# Patient Record
Sex: Female | Born: 1985 | Race: White | Hispanic: No | Marital: Married | Smoking: Current every day smoker
Health system: Southern US, Community
[De-identification: ages and names within clinical notes are randomized; demographics above are authoritative.]

## PROBLEM LIST (undated history)

## (undated) DIAGNOSIS — Z862 Personal history of diseases of the blood and blood-forming organs and certain disorders involving the immune mechanism: Secondary | ICD-10-CM

## (undated) DIAGNOSIS — Z8709 Personal history of other diseases of the respiratory system: Secondary | ICD-10-CM

## (undated) DIAGNOSIS — F32A Depression, unspecified: Secondary | ICD-10-CM

## (undated) HISTORY — DX: Personal history of other diseases of the respiratory system: Z87.09

## (undated) HISTORY — DX: Personal history of diseases of the blood and blood-forming organs and certain disorders involving the immune mechanism: Z86.2

## (undated) HISTORY — PX: TONSILLECTOMY: SUR1361

## (undated) HISTORY — PX: OTHER SURGICAL HISTORY: SHX169

## (undated) HISTORY — DX: Depression, unspecified: F32.A

---
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
## 2003-05-12 ENCOUNTER — Ambulatory Visit (HOSPITAL_COMMUNITY): Admission: RE | Admit: 2003-05-12 | Discharge: 2003-05-12 | Payer: Self-pay | Admitting: Orthopedic Surgery

## 2003-06-10 ENCOUNTER — Ambulatory Visit (HOSPITAL_COMMUNITY): Admission: RE | Admit: 2003-06-10 | Discharge: 2003-06-10 | Payer: Self-pay | Admitting: Orthopedic Surgery

## 2004-04-17 ENCOUNTER — Emergency Department: Payer: Self-pay | Admitting: Emergency Medicine

## 2004-10-02 ENCOUNTER — Emergency Department: Payer: Self-pay | Admitting: Emergency Medicine

## 2004-10-05 ENCOUNTER — Emergency Department: Payer: Self-pay | Admitting: Unknown Physician Specialty

## 2005-06-08 ENCOUNTER — Observation Stay: Payer: Self-pay | Admitting: Obstetrics and Gynecology

## 2005-06-09 ENCOUNTER — Observation Stay: Payer: Self-pay | Admitting: Obstetrics and Gynecology

## 2007-08-17 ENCOUNTER — Emergency Department: Payer: Self-pay | Admitting: Emergency Medicine

## 2007-10-21 ENCOUNTER — Encounter: Payer: Self-pay | Admitting: Maternal & Fetal Medicine

## 2008-01-04 ENCOUNTER — Observation Stay: Payer: Self-pay | Admitting: Obstetrics and Gynecology

## 2008-01-28 ENCOUNTER — Observation Stay: Payer: Self-pay

## 2008-03-07 ENCOUNTER — Observation Stay: Payer: Self-pay

## 2008-03-15 ENCOUNTER — Inpatient Hospital Stay: Payer: Self-pay | Admitting: Obstetrics and Gynecology

## 2008-03-22 ENCOUNTER — Emergency Department: Payer: Self-pay | Admitting: Emergency Medicine

## 2008-03-26 ENCOUNTER — Emergency Department: Payer: Self-pay | Admitting: Unknown Physician Specialty

## 2011-08-16 ENCOUNTER — Emergency Department: Payer: Self-pay | Admitting: Emergency Medicine

## 2011-08-16 LAB — PREGNANCY, URINE: Pregnancy Test, Urine: NEGATIVE m[IU]/mL

## 2011-08-16 LAB — WET PREP, GENITAL

## 2011-08-16 LAB — URINALYSIS, COMPLETE
Bacteria: NONE SEEN
Bilirubin,UR: NEGATIVE
Blood: NEGATIVE
Glucose,UR: NEGATIVE mg/dL (ref 0–75)
Ketone: NEGATIVE
Leukocyte Esterase: NEGATIVE
Nitrite: NEGATIVE
Ph: 5 (ref 4.5–8.0)
Protein: NEGATIVE
RBC,UR: <1 /HPF (ref 0–5)
Specific Gravity: 1.023 (ref 1.003–1.030)
Squamous Epithelial: 8
WBC UR: 1 /HPF (ref 0–5)

## 2011-08-16 LAB — COMPREHENSIVE METABOLIC PANEL
Albumin: 4.4 g/dL (ref 3.4–5.0)
Alkaline Phosphatase: 62 U/L (ref 50–136)
Anion Gap: 9 (ref 7–16)
BUN: 15 mg/dL (ref 7–18)
Bilirubin,Total: 0.4 mg/dL (ref 0.2–1.0)
Calcium, Total: 9.3 mg/dL (ref 8.5–10.1)
Chloride: 105 mmol/L (ref 98–107)
Co2: 27 mmol/L (ref 21–32)
Creatinine: 0.69 mg/dL (ref 0.60–1.30)
EGFR (African American): >60
EGFR (Non-African Amer.): >60
Glucose: 88 mg/dL (ref 65–99)
Osmolality: 282 (ref 275–301)
Potassium: 3.8 mmol/L (ref 3.5–5.1)
SGOT(AST): 23 U/L (ref 15–37)
SGPT (ALT): 18 U/L
Sodium: 141 mmol/L (ref 136–145)
Total Protein: 7.6 g/dL (ref 6.4–8.2)

## 2011-08-16 LAB — CBC
HCT: 44.7 % (ref 35.0–47.0)
HGB: 15.2 g/dL (ref 12.0–16.0)
MCH: 31.4 pg (ref 26.0–34.0)
MCHC: 34.1 g/dL (ref 32.0–36.0)
MCV: 92 fL (ref 80–100)
Platelet: 257 10*3/uL (ref 150–440)
RBC: 4.85 10*6/uL (ref 3.80–5.20)
RDW: 11.7 % (ref 11.5–14.5)
WBC: 5.3 10*3/uL (ref 3.6–11.0)

## 2013-03-12 ENCOUNTER — Emergency Department: Payer: Self-pay | Admitting: Internal Medicine

## 2013-03-12 LAB — COMPREHENSIVE METABOLIC PANEL
Albumin: 3.2 g/dL — ABNORMAL LOW (ref 3.4–5.0)
Alkaline Phosphatase: 73 U/L (ref 50–136)
Anion Gap: 5 — ABNORMAL LOW (ref 7–16)
BUN: 10 mg/dL (ref 7–18)
Bilirubin,Total: 0.6 mg/dL (ref 0.2–1.0)
Calcium, Total: 8.5 mg/dL (ref 8.5–10.1)
Chloride: 105 mmol/L (ref 98–107)
Co2: 25 mmol/L (ref 21–32)
Creatinine: 0.77 mg/dL (ref 0.60–1.30)
EGFR (African American): >60
EGFR (Non-African Amer.): >60
Glucose: 114 mg/dL — ABNORMAL HIGH (ref 65–99)
Osmolality: 270 (ref 275–301)
Potassium: 4.3 mmol/L (ref 3.5–5.1)
SGOT(AST): 27 U/L (ref 15–37)
SGPT (ALT): 17 U/L (ref 12–78)
Sodium: 135 mmol/L — ABNORMAL LOW (ref 136–145)
Total Protein: 7 g/dL (ref 6.4–8.2)

## 2013-03-12 LAB — LIPASE, BLOOD: Lipase: 93 U/L (ref 73–393)

## 2013-03-12 LAB — URINALYSIS, COMPLETE
Glucose,UR: NEGATIVE mg/dL (ref 0–75)
Leukocyte Esterase: NEGATIVE
Protein: NEGATIVE
RBC,UR: <1 /HPF (ref 0–5)
Specific Gravity: 1.003 (ref 1.003–1.030)
Squamous Epithelial: 6

## 2013-03-12 LAB — CBC
HCT: 38.6 % (ref 35.0–47.0)
MCH: 32.2 pg (ref 26.0–34.0)
MCHC: 34.6 g/dL (ref 32.0–36.0)
MCV: 93 fL (ref 80–100)
RBC: 4.15 10*6/uL (ref 3.80–5.20)
WBC: 12.9 10*3/uL — ABNORMAL HIGH (ref 3.6–11.0)

## 2015-12-23 ENCOUNTER — Emergency Department
Admission: EM | Admit: 2015-12-23 | Discharge: 2015-12-24 | Disposition: A | Discharge status: Home / Self Care | Payer: Medicaid Other | Attending: Emergency Medicine | Admitting: Emergency Medicine

## 2015-12-23 DIAGNOSIS — R1030 Lower abdominal pain, unspecified: Secondary | ICD-10-CM | POA: Diagnosis present

## 2015-12-23 DIAGNOSIS — N83201 Unspecified ovarian cyst, right side: Secondary | ICD-10-CM | POA: Diagnosis not present

## 2015-12-23 DIAGNOSIS — N760 Acute vaginitis: Secondary | ICD-10-CM | POA: Diagnosis not present

## 2015-12-23 DIAGNOSIS — N83209 Unspecified ovarian cyst, unspecified side: Secondary | ICD-10-CM

## 2015-12-23 DIAGNOSIS — R109 Unspecified abdominal pain: Secondary | ICD-10-CM

## 2015-12-23 DIAGNOSIS — B9689 Other specified bacterial agents as the cause of diseases classified elsewhere: Secondary | ICD-10-CM

## 2015-12-23 LAB — URINALYSIS COMPLETE WITH MICROSCOPIC (ARMC ONLY)
BILIRUBIN URINE: NEGATIVE
Bacteria, UA: NONE SEEN
GLUCOSE, UA: NEGATIVE mg/dL
Hgb urine dipstick: NEGATIVE
KETONES UR: NEGATIVE mg/dL
Nitrite: NEGATIVE
Protein, ur: NEGATIVE mg/dL
SPECIFIC GRAVITY, URINE: 1.005 (ref 1.005–1.030)
pH: 8 (ref 5.0–8.0)

## 2015-12-23 LAB — COMPREHENSIVE METABOLIC PANEL
ALK PHOS: 71 U/L (ref 38–126)
ALT: 17 U/L (ref 14–54)
AST: 26 U/L (ref 15–41)
Albumin: 4.4 g/dL (ref 3.5–5.0)
Anion gap: 7 (ref 5–15)
BILIRUBIN TOTAL: 0.3 mg/dL (ref 0.3–1.2)
BUN: 11 mg/dL (ref 6–20)
CALCIUM: 9.2 mg/dL (ref 8.9–10.3)
CHLORIDE: 109 mmol/L (ref 101–111)
CO2: 24 mmol/L (ref 22–32)
CREATININE: 0.77 mg/dL (ref 0.44–1.00)
GFR calc Af Amer: >60 mL/min (ref >60)
Glucose, Bld: 76 mg/dL (ref 65–99)
Potassium: 3.9 mmol/L (ref 3.5–5.1)
Sodium: 140 mmol/L (ref 135–145)
TOTAL PROTEIN: 7.6 g/dL (ref 6.5–8.1)

## 2015-12-23 LAB — CBC
HCT: 42.3 % (ref 35.0–47.0)
HEMOGLOBIN: 14.6 g/dL (ref 12.0–16.0)
MCH: 31.3 pg (ref 26.0–34.0)
MCHC: 34.5 g/dL (ref 32.0–36.0)
MCV: 90.6 fL (ref 80.0–100.0)
PLATELETS: 269 10*3/uL (ref 150–440)
RBC: 4.67 MIL/uL (ref 3.80–5.20)
RDW: 13.2 % (ref 11.5–14.5)
WBC: 10 10*3/uL (ref 3.6–11.0)

## 2015-12-23 LAB — POCT PREGNANCY, URINE: PREG TEST UR: NEGATIVE

## 2015-12-23 MED ORDER — MORPHINE SULFATE (PF) 4 MG/ML IV SOLN
4.0000 mg | Freq: Once | INTRAVENOUS | Status: AC
  Administered 2015-12-24: 4 mg via INTRAVENOUS
  Filled 2015-12-23: qty 1

## 2015-12-23 MED ORDER — SODIUM CHLORIDE 0.9 % IV BOLUS (SEPSIS)
1000.0000 mL | Freq: Once | INTRAVENOUS | Status: AC
  Administered 2015-12-24: 1000 mL via INTRAVENOUS

## 2015-12-23 MED ORDER — ONDANSETRON HCL 4 MG/2ML IJ SOLN
4.0000 mg | Freq: Once | INTRAMUSCULAR | Status: AC
  Administered 2015-12-24: 4 mg via INTRAVENOUS
  Filled 2015-12-23: qty 2

## 2015-12-23 NOTE — ED Notes (Signed)
Pt in with co mid lower abd pain went to urgent care today and dx with constipation. Took miralax and had had watery stools since, last normal BM Tuesday.  Does have dysuria, and was told she had bacteria in urine but did not receive rx.

## 2015-12-23 NOTE — ED Provider Notes (Signed)
[facility] Emergency Department Provider Note   ____________________________________________  Time seen: Approximately 2323 PM  I have reviewed the triage vital signs and the nursing notes.   HISTORY  Chief Complaint Abdominal Pain    HPI Diamond Tyler is a 30 y.o. female who comes into the hospital today with abdominal pain. The patient reports that the pain started on Saturday. It is in her lower abdomen. The patient has not been taking anything for pain. She went to urgent care today and was told that she was constipated. She was given some MiraLAX and was told to come to the ER if it was worse. The patient reports that she did take MiraLAX powder bowel movement with no relief of her pain. She was also given a shot for pain but that has worn off. She reports that when she urinates she has a sharp pain in her abdomen. The patient denies any vaginal discharge or bleeding. She's had some nausea with no vomiting and no fever. The patient has never had this before. She reports that it feels like contractions. Her last menstrual period was 9 years ago as the patient has an IUD. The patient also reports it hurts to have a bowel movement.   No past medical history  There are no active problems to display for this patient.   No past surgical history  No current outpatient prescriptions  Allergies Motrin  No family history on file.  Social History Social History  Substance Use Topics  . Smoking status: None  . Smokeless tobacco: None  . Alcohol Use: None    Review of Systems Constitutional: No fever/chills Eyes: No visual changes. ENT: No sore throat. Cardiovascular: Denies chest pain. Respiratory: Denies shortness of breath. Gastrointestinal:  abdominal pain.  No nausea, no vomiting.  No diarrhea.  No constipation. Genitourinary: Negative for dysuria. Musculoskeletal: Negative for back pain. Skin: Negative for rash. Neurological: Negative  for headaches, focal weakness or numbness.  10-point ROS otherwise negative.  ____________________________________________   PHYSICAL EXAM:  VITAL SIGNS: ED Triage Vitals  Enc Vitals Group     BP 12/23/15 2000 137/95 mmHg     Pulse Rate 12/23/15 2000 118     Resp 12/23/15 2000 18     Temp 12/23/15 2000 98.4 F (36.9 C)     Temp Source 12/23/15 2000 Oral     SpO2 12/23/15 2000 100 %     Weight 12/23/15 2000 125 lb (56.7 kg)     Height 12/23/15 2000 5' (1.524 m)     Head Cir --      Peak Flow --      Pain Score 12/23/15 2001 7     Pain Loc --      Pain Edu? --      Excl. in GC? --     Constitutional: Alert and oriented. Well appearing and in moderate distress. Eyes: Conjunctivae are normal. PERRL. EOMI. Head: Atraumatic. Nose: No congestion/rhinnorhea. Mouth/Throat: Mucous membranes are moist.  Oropharynx non-erythematous. Cardiovascular: Normal rate, regular rhythm. Grossly normal heart sounds.  Good peripheral circulation. Respiratory: Normal respiratory effort.  No retractions. Lungs CTAB. Gastrointestinal: Soft with lower abdominal tenderness to palpation. No distention. Positive bowel sounds Musculoskeletal: No lower extremity tenderness nor edema.   Neurologic:  Normal speech and language.  Skin:  Skin is warm, dry and intact.  Psychiatric: Mood and affect are normal.   ____________________________________________   LABS (all labs ordered are listed, but only abnormal results are displayed)  Labs Reviewed  WET PREP, GENITAL - Abnormal; Notable for the following:    Clue Cells Wet Prep HPF POC PRESENT (*)    WBC, Wet Prep HPF POC RARE (*)    All other components within normal limits  URINALYSIS COMPLETEWITH MICROSCOPIC ([facility] ONLY) - Abnormal; Notable for the following:    Color, Urine STRAW (*)    APPearance CLEAR (*)    Leukocytes, UA TRACE (*)    Squamous Epithelial / LPF 0-5 (*)    All other components within normal limits  CHLAMYDIA/NGC RT PCR ([facility]  ONLY)  CBC  COMPREHENSIVE METABOLIC PANEL  POCT PREGNANCY, URINE   ____________________________________________  EKG  none ____________________________________________  RADIOLOGY  CT abdomen and pelvis: Right ovarian cyst with possible rupture, otherwise unremarkable CT of the abdomen and pelvis.  Ultrasound pelvis: Somewhat complex probably hemorrhagic right ovarian cyst, intrauterine device in place, unremarkable uterus and the left ovary. ____________________________________________   PROCEDURES  Procedure(s) performed: None  Critical Care performed: No  ____________________________________________   INITIAL IMPRESSION / ASSESSMENT AND PLAN / ED COURSE  Pertinent labs & imaging results that were available during my care of the patient were reviewed by me and considered in my medical decision making (see chart for details).  His is a 30 year old female who comes into the hospital today with some abdominal pain. The patient has been having this pain for almost a week. She is not been taken anything for pain but reports it is very uncomfortable. The patient does have a history of an IUD. I will do a pelvic exam and give the patient a dose of morphine and Zofran. She will be reassessed once I received the pelvic exam and the decision will be made whether she needs an ultrasound or CT scan.  The patient initially received an ultrasound which showed a hemorrhagic right ovarian cyst. Since the patient was having uterine pain as well I sent her for a CT scan which showed the same thing. The patient received 2 doses of morphine as well as a dose of Percocet. I will give her some antibiotics for her bacterial vaginosis but she'll be discharged home to follow-up with her primary care physician. The patient has some further complaints or concerns at this time. ____________________________________________   FINAL CLINICAL IMPRESSION(S) / ED DIAGNOSES  Final diagnoses:    Abdominal pain  Hemorrhagic ovarian cyst  Bacterial vaginosis      NEW MEDICATIONS STARTED DURING THIS VISIT:  Discharge Medication List as of 12/24/2015  3:54 AM    START taking these medications   Details  metroNIDAZOLE (FLAGYL) 500 MG tablet Take 1 tablet (500 mg total) by mouth 3 (three) times daily., Starting 12/24/2015, Until Fri 12/31/15, Print         Note:  This document was prepared using Dragon voice recognition software and may include unintentional dictation errors.    Rebecka Apley, MD 12/24/15 [phone]

## 2015-12-24 ENCOUNTER — Encounter: Payer: Self-pay | Admitting: Radiology

## 2015-12-24 ENCOUNTER — Emergency Department: Payer: Medicaid Other

## 2015-12-24 LAB — CHLAMYDIA/NGC RT PCR (ARMC ONLY)
CHLAMYDIA TR: NOT DETECTED
N gonorrhoeae: NOT DETECTED

## 2015-12-24 LAB — WET PREP, GENITAL
Sperm: NONE SEEN
TRICH WET PREP: NONE SEEN
Yeast Wet Prep HPF POC: NONE SEEN

## 2015-12-24 MED ORDER — MORPHINE SULFATE (PF) 4 MG/ML IV SOLN
4.0000 mg | Freq: Once | INTRAVENOUS | Status: AC
  Administered 2015-12-24: 4 mg via INTRAVENOUS
  Filled 2015-12-24: qty 1

## 2015-12-24 MED ORDER — IOPAMIDOL (ISOVUE-300) INJECTION 61%
100.0000 mL | Freq: Once | INTRAVENOUS | Status: AC | PRN
  Administered 2015-12-24: 100 mL via INTRAVENOUS

## 2015-12-24 MED ORDER — METRONIDAZOLE 500 MG PO TABS: 500.0000 mg | ORAL_TABLET | Freq: Three times a day (TID) | ORAL | Status: AC

## 2015-12-24 MED ORDER — OXYCODONE-ACETAMINOPHEN 5-325 MG PO TABS
1.0000 | ORAL_TABLET | Freq: Once | ORAL | Status: AC
Start: 2015-12-24 — End: 2015-12-24
  Administered 2015-12-24: 1 via ORAL
  Filled 2015-12-24: qty 1

## 2015-12-24 MED ORDER — DIATRIZOATE MEGLUMINE & SODIUM 66-10 % PO SOLN
15.0000 mL | Freq: Once | ORAL | Status: AC
  Administered 2015-12-24: 15 mL via ORAL

## 2015-12-24 MED ORDER — OXYCODONE-ACETAMINOPHEN 5-325 MG PO TABS: 1.0000 | ORAL_TABLET | Freq: Four times a day (QID) | ORAL | Status: DC | PRN

## 2015-12-24 NOTE — ED Notes (Signed)
Pt to CT via stretcher accomp by CT tech 

## 2015-12-24 NOTE — ED Notes (Signed)
CT tech notified pt completed drinking contrast 

## 2015-12-24 NOTE — ED Notes (Signed)
Pt to u/s via stretcher accomp by tech

## 2015-12-24 NOTE — Discharge Instructions (Signed)
Bacterial Vaginosis °Bacterial vaginosis is a vaginal infection that occurs when the normal balance of bacteria in the vagina is disrupted. It results from an overgrowth of certain bacteria. This is the most common vaginal infection in women of childbearing age. Treatment is important to prevent complications, especially in pregnant women, as it can cause a premature delivery. °CAUSES  °Bacterial vaginosis is caused by an increase in harmful bacteria that are normally present in smaller amounts in the vagina. Several different kinds of bacteria can cause bacterial vaginosis. However, the reason that the condition develops is not fully understood. °RISK FACTORS °Certain activities or behaviors can put you at an increased risk of developing bacterial vaginosis, including: °· Having a new sex partner or multiple sex partners. °· Douching. °· Using an intrauterine device (IUD) for contraception. °Women do not get bacterial vaginosis from toilet seats, bedding, swimming pools, or contact with objects around them. °SIGNS AND SYMPTOMS  °Some women with bacterial vaginosis have no signs or symptoms. Common symptoms include: °· Grey vaginal discharge. °· A fishlike odor with discharge, especially after sexual intercourse. °· Itching or burning of the vagina and vulva. °· Burning or pain with urination. °DIAGNOSIS  °Your health care provider will take a medical history and examine the vagina for signs of bacterial vaginosis. A sample of vaginal fluid may be taken. Your health care provider will look at this sample under a microscope to check for bacteria and abnormal cells. A vaginal pH test may also be done.  °TREATMENT  °Bacterial vaginosis may be treated with antibiotic medicines. These may be given in the form of a pill or a vaginal cream. A second round of antibiotics may be prescribed if the condition comes back after treatment. Because bacterial vaginosis increases your risk for sexually transmitted diseases, getting  treated can help reduce your risk for chlamydia, gonorrhea, HIV, and herpes. °HOME CARE INSTRUCTIONS  °· Only take over-the-counter or prescription medicines as directed by your health care provider. °· If antibiotic medicine was prescribed, take it as directed. Make sure you finish it even if you start to feel better. °· Tell all sexual partners that you have a vaginal infection. They should see their health care provider and be treated if they have problems, such as a mild rash or itching. °· During treatment, it is important that you follow these instructions: °¨ Avoid sexual activity or use condoms correctly. °¨ Do not douche. °¨ Avoid alcohol as directed by your health care provider. °¨ Avoid breastfeeding as directed by your health care provider. °SEEK MEDICAL CARE IF:  °· Your symptoms are not improving after 3 days of treatment. °· You have increased discharge or pain. °· You have a fever. °MAKE SURE YOU:  °· Understand these instructions. °· Will watch your condition. °· Will get help right away if you are not doing well or get worse. °FOR MORE INFORMATION  °Centers for Disease Control and Prevention, Division of STD Prevention: www.cdc.gov/std °American Sexual Health Association (ASHA): www.ashastd.org  °  °This information is not intended to replace advice given to you by your health care provider. Make sure you discuss any questions you have with your health care provider. °  °Document Released: 07/03/2005 Document Revised: 07/24/2014 Document Reviewed: 02/12/2013 °Elsevier Interactive Patient Education ©2016 [employer] ° ° °Abdominal Pain, Adult °Many things can cause abdominal pain. Usually, abdominal pain is not caused by a disease and will improve without treatment. It can often be observed and treated at home. Your health care   provider will do a physical exam and possibly order blood tests and X-rays to help determine the seriousness of your pain. However, in many cases, more time must pass  before a clear cause of the pain can be found. Before that point, your health care provider may not know if you need more testing or further treatment. °HOME CARE INSTRUCTIONS °Monitor your abdominal pain for any changes. The following actions may help to alleviate any discomfort you are experiencing: °· Only take over-the-counter or prescription medicines as directed by your health care provider. °· Do not take laxatives unless directed to do so by your health care provider. °· Try a clear liquid diet (broth, tea, or water) as directed by your health care provider. Slowly move to a bland diet as tolerated. °SEEK MEDICAL CARE IF: °· You have unexplained abdominal pain. °· You have abdominal pain associated with nausea or diarrhea. °· You have pain when you urinate or have a bowel movement. °· You experience abdominal pain that wakes you in the night. °· You have abdominal pain that is worsened or improved by eating food. °· You have abdominal pain that is worsened with eating fatty foods. °· You have a fever. °SEEK IMMEDIATE MEDICAL CARE IF: °· Your pain does not go away within 2 hours. °· You keep throwing up (vomiting). °· Your pain is felt only in portions of the abdomen, such as the right side or the left lower portion of the abdomen. °· You pass bloody or black tarry stools. °MAKE SURE YOU: °· Understand these instructions. °· Will watch your condition. °· Will get help right away if you are not doing well or get worse. °  °This information is not intended to replace advice given to you by your health care provider. Make sure you discuss any questions you have with your health care provider. °  °Document Released: 04/12/2005 Document Revised: 03/24/2015 Document Reviewed: 03/12/2013 °Elsevier Interactive Patient Education ©2016 Elsevier Inc. ° °

## 2016-01-06 ENCOUNTER — Ambulatory Visit
Admission: RE | Admit: 2016-01-06 | Discharge: 2016-01-06 | Disposition: A | Discharge status: Home / Self Care | Payer: Medicaid Other | Source: Ambulatory Visit | Attending: Obstetrics and Gynecology | Admitting: Obstetrics and Gynecology

## 2016-01-06 ENCOUNTER — Ambulatory Visit: Payer: Medicaid Other | Admitting: Anesthesiology

## 2016-01-06 ENCOUNTER — Encounter: Payer: Self-pay | Admitting: *Deleted

## 2016-01-06 ENCOUNTER — Encounter
Admission: RE | Disposition: A | Discharge status: Home / Self Care | Payer: Self-pay | Source: Ambulatory Visit | Attending: Obstetrics and Gynecology

## 2016-01-06 DIAGNOSIS — N736 Female pelvic peritoneal adhesions (postinfective): Secondary | ICD-10-CM | POA: Insufficient documentation

## 2016-01-06 DIAGNOSIS — N83201 Unspecified ovarian cyst, right side: Secondary | ICD-10-CM | POA: Diagnosis present

## 2016-01-06 DIAGNOSIS — R1032 Left lower quadrant pain: Secondary | ICD-10-CM | POA: Diagnosis present

## 2016-01-06 DIAGNOSIS — F17210 Nicotine dependence, cigarettes, uncomplicated: Secondary | ICD-10-CM | POA: Diagnosis not present

## 2016-01-06 HISTORY — PX: LAPAROSCOPY: SHX197

## 2016-01-06 LAB — TYPE AND SCREEN
ABO/RH(D): O NEG
ANTIBODY SCREEN: NEGATIVE

## 2016-01-06 LAB — CBC
HEMATOCRIT: 43.4 % (ref 35.0–47.0)
Hemoglobin: 14.6 g/dL (ref 12.0–16.0)
MCH: 30.9 pg (ref 26.0–34.0)
MCHC: 33.7 g/dL (ref 32.0–36.0)
MCV: 91.8 fL (ref 80.0–100.0)
Platelets: 266 10*3/uL (ref 150–440)
RBC: 4.73 MIL/uL (ref 3.80–5.20)
RDW: 13.1 % (ref 11.5–14.5)
WBC: 8.1 10*3/uL (ref 3.6–11.0)

## 2016-01-06 LAB — POCT PREGNANCY, URINE: Preg Test, Ur: NEGATIVE

## 2016-01-06 LAB — COMPREHENSIVE METABOLIC PANEL
ALBUMIN: 4.6 g/dL (ref 3.5–5.0)
ALT: 19 U/L (ref 14–54)
ANION GAP: 7 (ref 5–15)
AST: 25 U/L (ref 15–41)
Alkaline Phosphatase: 59 U/L (ref 38–126)
BILIRUBIN TOTAL: 0.5 mg/dL (ref 0.3–1.2)
BUN: 13 mg/dL (ref 6–20)
CO2: 26 mmol/L (ref 22–32)
Calcium: 9.2 mg/dL (ref 8.9–10.3)
Chloride: 104 mmol/L (ref 101–111)
Creatinine, Ser: 0.77 mg/dL (ref 0.44–1.00)
GFR calc Af Amer: >60 mL/min (ref >60)
GLUCOSE: 98 mg/dL (ref 65–99)
POTASSIUM: 4.3 mmol/L (ref 3.5–5.1)
Sodium: 137 mmol/L (ref 135–145)
TOTAL PROTEIN: 7.4 g/dL (ref 6.5–8.1)

## 2016-01-06 SURGERY — LAPAROSCOPY, DIAGNOSTIC
Anesthesia: General | Wound class: Clean Contaminated

## 2016-01-06 MED ORDER — DEXAMETHASONE SODIUM PHOSPHATE 10 MG/ML IJ SOLN
INTRAMUSCULAR | Status: DC | PRN
  Administered 2016-01-06: 10 mg via INTRAVENOUS

## 2016-01-06 MED ORDER — FENTANYL CITRATE (PF) 100 MCG/2ML IJ SOLN
INTRAMUSCULAR | Status: DC | PRN
  Administered 2016-01-06: 100 ug via INTRAVENOUS
  Administered 2016-01-06 (×2): 50 ug via INTRAVENOUS

## 2016-01-06 MED ORDER — FENTANYL CITRATE (PF) 100 MCG/2ML IJ SOLN
INTRAMUSCULAR | Status: AC
  Administered 2016-01-06: 25 ug via INTRAVENOUS
  Filled 2016-01-06: qty 2

## 2016-01-06 MED ORDER — OXYCODONE HCL 5 MG PO TABS
ORAL_TABLET | ORAL | Status: AC
  Filled 2016-01-06: qty 1

## 2016-01-06 MED ORDER — ROCURONIUM BROMIDE 100 MG/10ML IV SOLN
INTRAVENOUS | Status: DC | PRN
  Administered 2016-01-06: 30 mg via INTRAVENOUS

## 2016-01-06 MED ORDER — ONDANSETRON HCL 4 MG/2ML IJ SOLN
INTRAMUSCULAR | Status: DC | PRN
  Administered 2016-01-06: 4 mg via INTRAVENOUS

## 2016-01-06 MED ORDER — KETOROLAC TROMETHAMINE 30 MG/ML IJ SOLN
INTRAMUSCULAR | Status: DC | PRN
  Administered 2016-01-06: 30 mg via INTRAVENOUS

## 2016-01-06 MED ORDER — LACTATED RINGERS IV SOLN
INTRAVENOUS | Status: DC
  Administered 2016-01-06: 17:00:00 via INTRAVENOUS
  Administered 2016-01-06: 125 mL/h via INTRAVENOUS

## 2016-01-06 MED ORDER — MIDAZOLAM HCL 2 MG/2ML IJ SOLN
INTRAMUSCULAR | Status: DC | PRN
  Administered 2016-01-06: 2 mg via INTRAVENOUS

## 2016-01-06 MED ORDER — OXYCODONE-ACETAMINOPHEN 5-325 MG PO TABS
2.0000 | ORAL_TABLET | Freq: Four times a day (QID) | ORAL | Status: DC | PRN
  Administered 2016-01-06: 2 via ORAL

## 2016-01-06 MED ORDER — LIDOCAINE HCL (CARDIAC) 20 MG/ML IV SOLN
INTRAVENOUS | Status: DC | PRN
  Administered 2016-01-06: 10 mg via INTRAVENOUS

## 2016-01-06 MED ORDER — OXYCODONE-ACETAMINOPHEN 5-325 MG PO TABS: 2.0000 | ORAL_TABLET | Freq: Four times a day (QID) | ORAL | Status: AC | PRN

## 2016-01-06 MED ORDER — ONDANSETRON HCL 4 MG/2ML IJ SOLN: 4.0000 mg | Freq: Once | INTRAMUSCULAR | Status: DC | PRN

## 2016-01-06 MED ORDER — FENTANYL CITRATE (PF) 100 MCG/2ML IJ SOLN
25.0000 ug | INTRAMUSCULAR | Status: AC | PRN
  Administered 2016-01-06 (×6): 25 ug via INTRAVENOUS

## 2016-01-06 MED ORDER — PHENYLEPHRINE HCL 10 MG/ML IJ SOLN
INTRAMUSCULAR | Status: DC | PRN
  Administered 2016-01-06: 100 ug via INTRAVENOUS

## 2016-01-06 MED ORDER — BUPIVACAINE HCL (PF) 0.5 % IJ SOLN
INTRAMUSCULAR | Status: AC
  Filled 2016-01-06: qty 30

## 2016-01-06 MED ORDER — PROPOFOL 10 MG/ML IV BOLUS
INTRAVENOUS | Status: DC | PRN
  Administered 2016-01-06: 130 mg via INTRAVENOUS

## 2016-01-06 MED ORDER — BUPIVACAINE HCL 0.5 % IJ SOLN
INTRAMUSCULAR | Status: DC | PRN
  Administered 2016-01-06: 5 mL

## 2016-01-06 MED ORDER — OXYCODONE-ACETAMINOPHEN 5-325 MG PO TABS
ORAL_TABLET | ORAL | Status: AC
  Administered 2016-01-06: 2 via ORAL
  Filled 2016-01-06: qty 2

## 2016-01-06 SURGICAL SUPPLY — 39 items
BLADE SURG SZ11 CARB STEEL (BLADE) ×4 IMPLANT
CANISTER SUCT 1200ML W/VALVE (MISCELLANEOUS) ×4 IMPLANT
CHLORAPREP W/TINT 26ML (MISCELLANEOUS) ×4 IMPLANT
CORD MONOPOLAR M/FML 12FT (MISCELLANEOUS) ×3 IMPLANT
DRAPE LEGGINS SURG 28X43 STRL (DRAPES) ×4 IMPLANT
DRAPE SHEET LG 3/4 BI-LAMINATE (DRAPES) ×4 IMPLANT
DRAPE UNDER BUTTOCK W/FLU (DRAPES) ×4 IMPLANT
ELECT REM PT RETURN 9FT ADLT (ELECTROSURGICAL) ×4
ELECTRODE REM PT RTRN 9FT ADLT (ELECTROSURGICAL) ×2 IMPLANT
GLOVE BIO SURGEON STRL SZ7 (GLOVE) ×8 IMPLANT
GLOVE BIOGEL PI IND STRL 7.5 (GLOVE) ×2 IMPLANT
GLOVE BIOGEL PI INDICATOR 7.5 (GLOVE) ×2
GOWN STRL REUS W/ TWL LRG LVL3 (GOWN DISPOSABLE) ×4 IMPLANT
GOWN STRL REUS W/TWL LRG LVL3 (GOWN DISPOSABLE) ×8
IRRIGATION STRYKERFLOW (MISCELLANEOUS) ×2 IMPLANT
IRRIGATOR STRYKERFLOW (MISCELLANEOUS) ×4
IV LACTATED RINGERS 1000ML (IV SOLUTION) ×4 IMPLANT
KIT RM TURNOVER CYSTO AR (KITS) ×4 IMPLANT
LABEL OR SOLS (LABEL) ×4 IMPLANT
LIQUID BAND (GAUZE/BANDAGES/DRESSINGS) ×4 IMPLANT
NDL SAFETY 22GX1.5 (NEEDLE) ×4 IMPLANT
NS IRRIG 500ML POUR BTL (IV SOLUTION) ×4 IMPLANT
PACK LAP CHOLECYSTECTOMY (MISCELLANEOUS) ×4 IMPLANT
PAD OB MATERNITY 4.3X12.25 (PERSONAL CARE ITEMS) ×4 IMPLANT
PAD PREP 24X41 OB/GYN DISP (PERSONAL CARE ITEMS) ×4 IMPLANT
SCISSORS METZENBAUM CVD 33 (INSTRUMENTS) IMPLANT
SHEARS HARMONIC ACE PLUS 36CM (ENDOMECHANICALS) IMPLANT
SLEEVE ENDOPATH XCEL 5M (ENDOMECHANICALS) ×4 IMPLANT
SOL PREP PVP 2OZ (MISCELLANEOUS) ×4
SOLUTION PREP PVP 2OZ (MISCELLANEOUS) ×2 IMPLANT
SURGILUBE 2OZ TUBE FLIPTOP (MISCELLANEOUS) ×4 IMPLANT
SUT MNCRL 4-0 (SUTURE)
SUT MNCRL 4-0 27XMFL (SUTURE)
SUT VIC AB 2-0 UR6 27 (SUTURE) IMPLANT
SUTURE MNCRL 4-0 27XMF (SUTURE) IMPLANT
TROCAR ENDO BLADELESS 11MM (ENDOMECHANICALS) IMPLANT
TROCAR XCEL NON-BLD 5MMX100MML (ENDOMECHANICALS) ×4 IMPLANT
TROCAR XCEL UNIV SLVE 11M 100M (ENDOMECHANICALS) IMPLANT
TUBING INSUFFLATOR HI FLOW (MISCELLANEOUS) ×4 IMPLANT

## 2016-01-06 NOTE — Discharge Instructions (Signed)

## 2016-01-06 NOTE — Anesthesia Procedure Notes (Signed)
Procedure Name: Intubation Date/Time: 01/06/2016 3:13 PM Performed by: Omer JackWEATHERLY, Diamond Wyse Pre-anesthesia Checklist: Patient identified, Patient being monitored, Timeout performed, Emergency Drugs available and Suction available Patient Re-evaluated:Patient Re-evaluated prior to inductionOxygen Delivery Method: Circle system utilized Preoxygenation: Pre-oxygenation with 100% oxygen Intubation Type: IV induction Ventilation: Mask ventilation without difficulty Laryngoscope Size: Mac and 3 Grade View: Grade I Tube type: Oral Tube size: 7.0 mm Number of attempts: 1 Airway Equipment and Method: Stylet Placement Confirmation: ETT inserted through vocal cords under direct vision,  positive ETCO2 and breath sounds checked- equal and bilateral Secured at: 21 cm Tube secured with: Tape Dental Injury: Teeth and Oropharynx as per pre-operative assessment

## 2016-01-06 NOTE — Op Note (Signed)
Operative Report  Pre-Op Diagnosis:  1) left lower quadrant abdominal pain 2) right ovarian cyst  Post-Op Diagnosis:  1) left lower quadrant abdominal pain 2) right ovarian cyst   Procedures:  1. Diagnostic laparosopy 2. Lysis of adhesions  Primary Surgeon: Thomasene MohairStephen Jackson, MD   EBL: 50 ml   IVF: 900 mL   Urine output: 200 mL  Specimens: None  Drains: None  Complications: None   Disposition: PACU   Condition: Stable   Findings:  1) adhesions of right pelvic sidewall, omentum, sigmoid epiploica to right lower posterior uterine body 2) normal-appearing appendix 3) Otherwise, normal-appearing uterus, fallopian tubes, and left ovary.  4) The right ovary could not fully be visualized as it was adherent to the right pelvic sidewall in the area of inflammation. No apparent cysts or other abnormalities.  Procedure Summary:  The patient was taken to the operating room where general anesthesia was administered and found to be adequate. She was placed in the dorsal supine lithotomy position in [place] stirrups and prepped and draped in usual sterile fashion. After a timeout was called an indwelling catheter was placed in her bladder. A sterile speculum was placed in the vagina and a single-tooth tenaculum was used to grasp the anterior lip of the cervix. An acorn uterine manipulator was affixed to the tenaculum. The speculum was removed from the vagina.  Attention was turned to the abdomen where after injection of local anesthetic, a 5 mm infraumbilical incision was made with the scalpel. Entry into the abdomen was obtained via Optiview trocar technique (a blunt entry technique with camera visualization through the obturator upon entry). Verification of entry into the abdomen was obtained using opening pressures. The abdomen was insufflated with CO2. The camera was introduced through the trocar with verification of atraumatic entry.  After an initial survey of the abdomen and pelvis  with the above-noted findings, a 5mm suprapubic port site was created under direct intraabdominal camera visualization without difficulty.  The adhesions of the omentum and epiploica to the uterus were easily lysed with gentle traction.  Inspection of the omentum and epiploica showed an area of either hematoma or necrosis as it was dark in color.  This segment was about 2 cm in length.    The surgery was terminated at this point.  Verification of hemostasis in the abdomen/pelvis was ascertained.  The abdomen was emptied of CO2 and the trocars were removed. The skin was closed at the incision sites with a subcutaneous reinforcement stitch and the skin was closed using surgical skin glue.    The indwelling catheter was removed from the bladder.  The acorn uterine manipulator was removed and the single tooth tenaculum was removed.  Silver nitrate was applied at the tenaculum entry sites and hemostasis was obtained. The patients IUD string was visualized in the proper location at the end of the case.  No instruments or sponges were left in the vagina.    Sponge, lap, needle, and instrument counts were correct x 2.  VTE prophylaxis: SCDs. Antibiotic prophylaxis: None indicated. The patient tolerated the procedure well and was taken to the PACU in stable condition.   Thomasene MohairStephen Jackson, MD 01/06/2016 4:06 PM

## 2016-01-06 NOTE — Anesthesia Postprocedure Evaluation (Signed)
Anesthesia Post Note  Patient: Diamond Tyler  Procedure(s) Performed: Procedure(s) (LRB): LAPAROSCOPY DIAGNOSTIC, lysis of adhesion (N/A)  Patient location during evaluation: PACU Anesthesia Type: General Level of consciousness: awake and alert Pain management: pain level controlled Vital Signs Assessment: post-procedure vital signs reviewed and stable Respiratory status: spontaneous breathing, nonlabored ventilation, respiratory function stable and patient connected to nasal cannula oxygen Cardiovascular status: blood pressure returned to baseline and stable Postop Assessment: no signs of nausea or vomiting Anesthetic complications: no    Last Vitals:  Filed Vitals:   01/06/16 1715 01/06/16 1724  BP: 126/68 132/59  Pulse: 67 64  Temp:    Resp: 14 16    Last Pain:  Filed Vitals:   01/06/16 1741  PainSc: 2                  Lenard SimmerAndrew Roque Schill

## 2016-01-06 NOTE — Transfer of Care (Signed)
Immediate Anesthesia Transfer of Care Note  Patient: Diamond Tyler  Procedure(s) Performed: Procedure(s): LAPAROSCOPY DIAGNOSTIC, lysis of adhesion (N/A)  Patient Location: PACU  Anesthesia Type:General  Level of Consciousness: awake, alert  and oriented  Airway & Oxygen Therapy: Patient Spontanous Breathing and Patient connected to face mask oxygen  Post-op Assessment: Report given to RN and Post -op Vital signs reviewed and stable  Post vital signs: Reviewed and stable  Last Vitals:  Filed Vitals:   01/06/16 1314 01/06/16 1610  BP: 119/61 112/62  Pulse: 78 54  Temp: 37.1 C 36.3 C  Resp: 18 12    Last Pain:  Filed Vitals:   01/06/16 1615  PainSc: 4          Complications: No apparent anesthesia complications
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
## 2016-01-06 NOTE — H&P (Signed)
History and Physical Interval Note:  Diamond MendsRebecca Tyler  has presented today for surgery, with the diagnosis of pelvic pain  The various methods of treatment have been discussed with the patient and family. After consideration of risks, benefits and other options for treatment, the patient has consented to  Procedure(s): LAPAROSCOPY DIAGNOSTIC (N/A) LAPAROSCOPIC OVARIAN CYSTECTOMY (Right) as a surgical intervention .  The patient's history has been reviewed, patient examined, no change in status, stable for surgery.  I have reviewed the patient's chart and labs.  Questions were answered to the patient's satisfaction.    Conard NovakJackson, Rosanne Wohlfarth D, MD 01/06/2016 2:46 PM

## 2016-01-07 ENCOUNTER — Encounter: Payer: Self-pay | Admitting: Obstetrics and Gynecology

## 2016-05-30 ENCOUNTER — Emergency Department: Payer: No Typology Code available for payment source

## 2016-05-30 ENCOUNTER — Emergency Department
Admission: EM | Admit: 2016-05-30 | Discharge: 2016-05-30 | Disposition: A | Discharge status: Home / Self Care | Payer: No Typology Code available for payment source | Attending: Emergency Medicine | Admitting: Emergency Medicine

## 2016-05-30 ENCOUNTER — Encounter: Payer: Self-pay | Admitting: *Deleted

## 2016-05-30 DIAGNOSIS — Y9389 Activity, other specified: Secondary | ICD-10-CM | POA: Diagnosis not present

## 2016-05-30 DIAGNOSIS — S139XXA Sprain of joints and ligaments of unspecified parts of neck, initial encounter: Secondary | ICD-10-CM | POA: Diagnosis not present

## 2016-05-30 DIAGNOSIS — F17210 Nicotine dependence, cigarettes, uncomplicated: Secondary | ICD-10-CM | POA: Insufficient documentation

## 2016-05-30 DIAGNOSIS — S169XXA Unspecified injury of muscle, fascia and tendon at neck level, initial encounter: Secondary | ICD-10-CM | POA: Diagnosis present

## 2016-05-30 DIAGNOSIS — Y999 Unspecified external cause status: Secondary | ICD-10-CM | POA: Insufficient documentation

## 2016-05-30 DIAGNOSIS — Y92410 Unspecified street and highway as the place of occurrence of the external cause: Secondary | ICD-10-CM | POA: Diagnosis not present

## 2016-05-30 MED ORDER — MELOXICAM 7.5 MG PO TABS: 7.5000 mg | ORAL_TABLET | Freq: Every day | ORAL | 2 refills | Status: AC

## 2016-05-30 MED ORDER — CYCLOBENZAPRINE HCL 5 MG PO TABS: 5.0000 mg | ORAL_TABLET | Freq: Three times a day (TID) | ORAL | 0 refills | Status: AC | PRN

## 2016-05-30 MED ORDER — ACETAMINOPHEN 325 MG PO TABS
650.0000 mg | ORAL_TABLET | Freq: Once | ORAL | Status: AC
  Administered 2016-05-30: 650 mg via ORAL
  Filled 2016-05-30: qty 2
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
## 2016-05-30 NOTE — ED Notes (Signed)
No obvious bruising or injuries noted. Skin warm and dry.
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
## 2017-07-02 IMAGING — US US TRANSVAGINAL NON-OB
1 series · 13 of 25 positions shown · non-contrast
Comparison: Ultrasound dated 08/16/2011

CLINICAL DATA: 29-year-old female with lower abdominal pain.

EXAM:
TRANSABDOMINAL AND TRANSVAGINAL ULTRASOUND OF PELVIS
TECHNIQUE: Both transabdominal and transvaginal ultrasound examinations of the
pelvis were performed. Transabdominal technique was performed for
global imaging of the pelvis including uterus, ovaries, adnexal
regions, and pelvic cul-de-sac. It was necessary to proceed with
endovaginal exam following the transabdominal exam to visualize the
endometrium and the ovaries.

[Series 1: us transvaginal non-ob · 0.21mm/px · 13 of 56 slices shown]
[im 1/56]
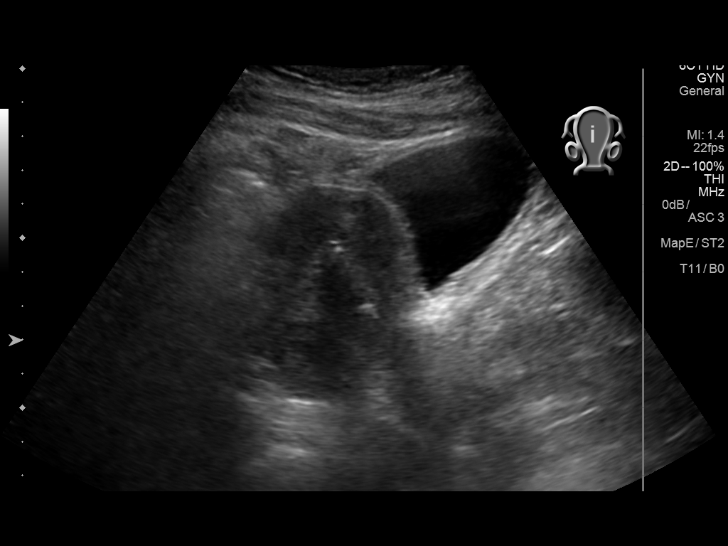
[im 5/56]
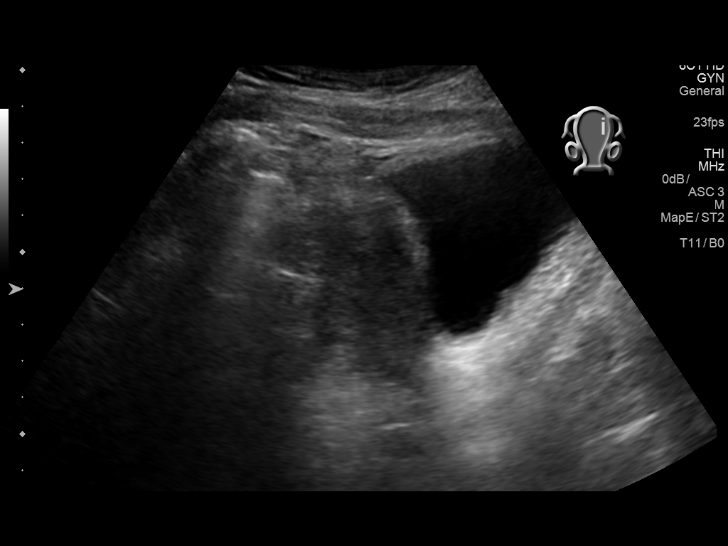
[im 10/56]
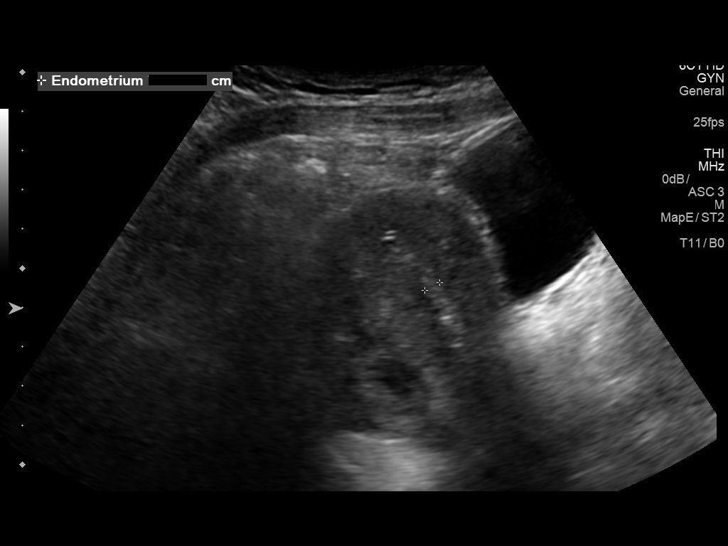
[im 14/56]
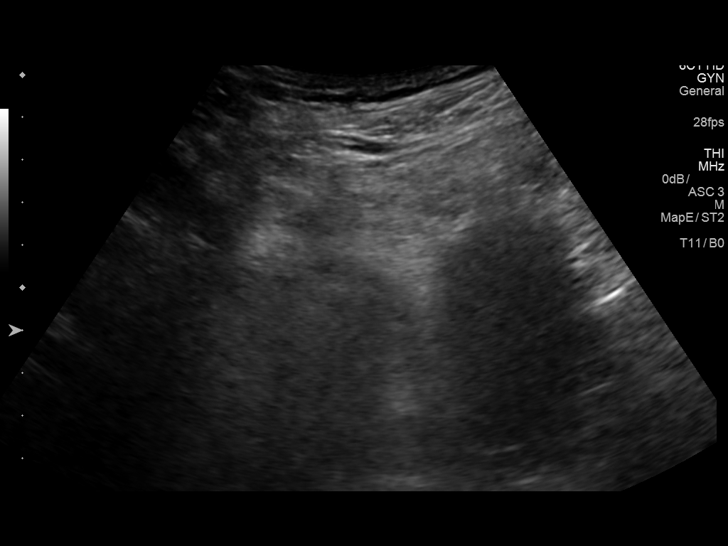
[im 19/56]
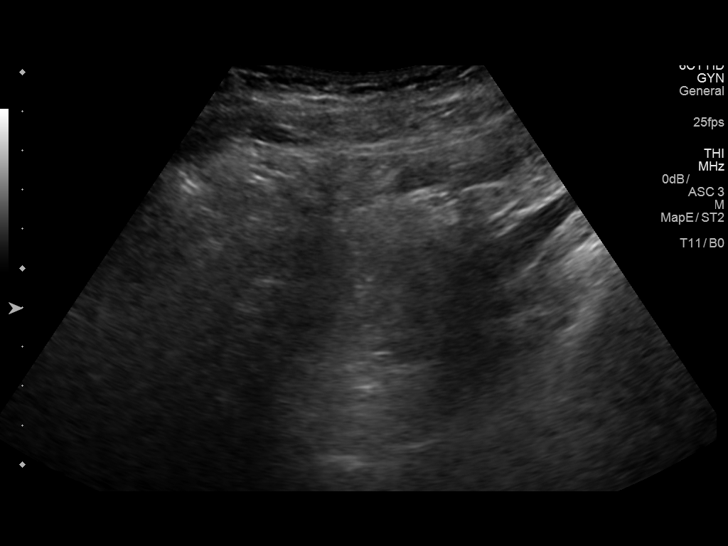
[im 23/56]
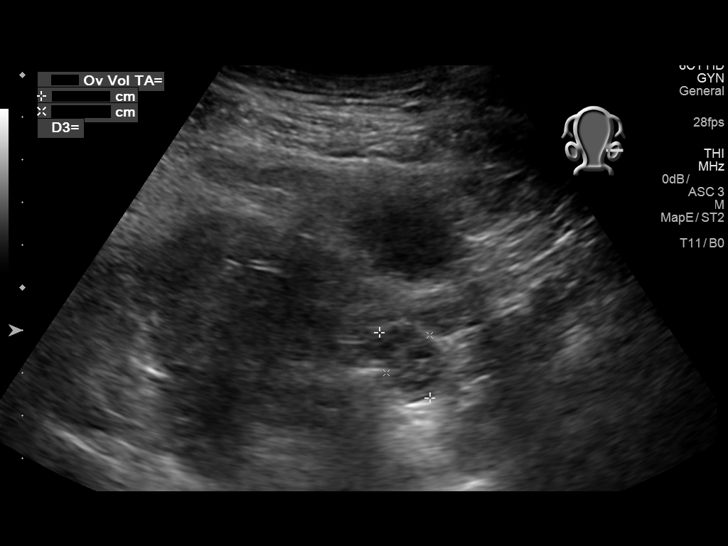
[im 28/56]
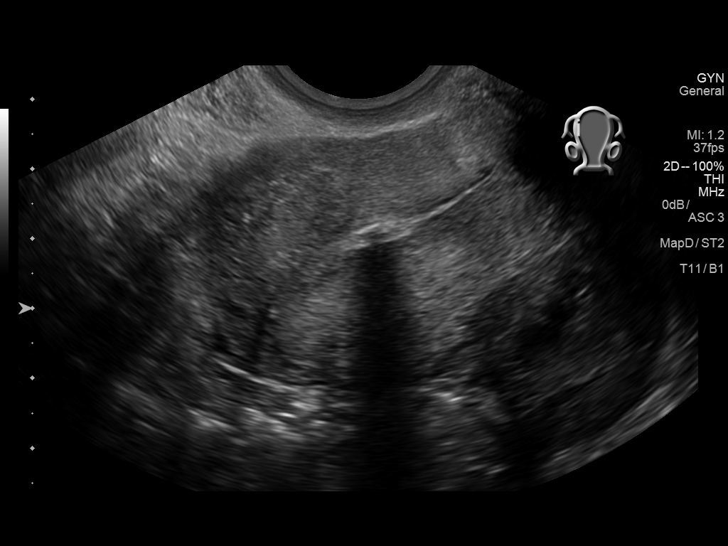
[im 33/56]
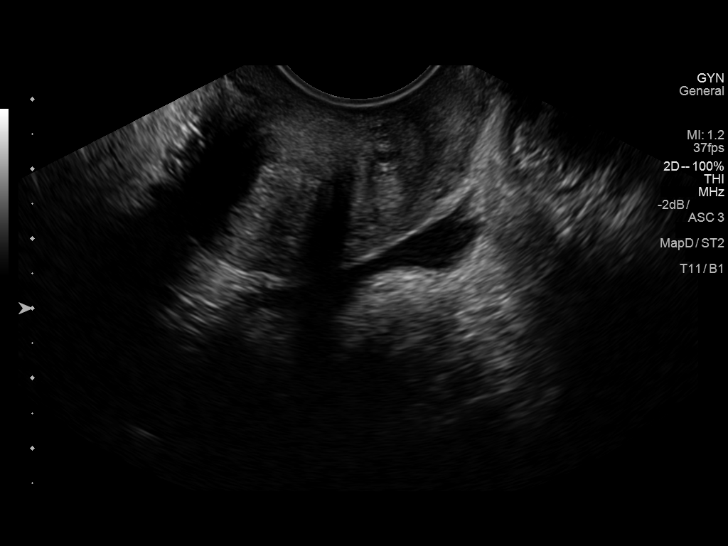
[im 37/56]
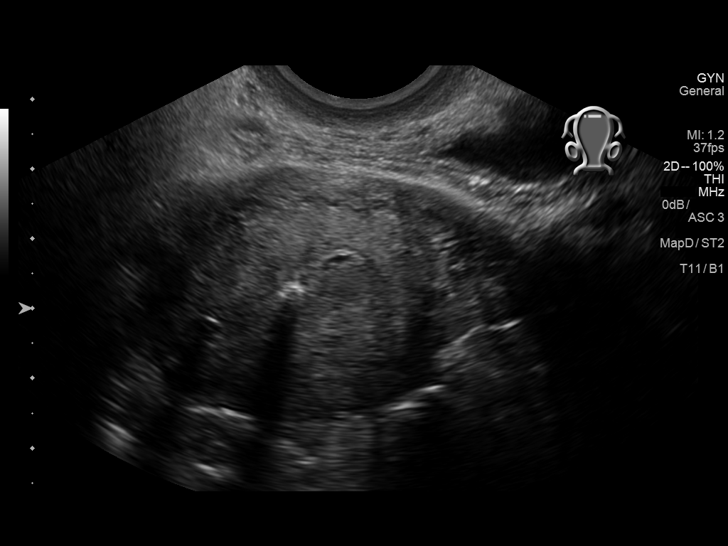
[im 42/56]
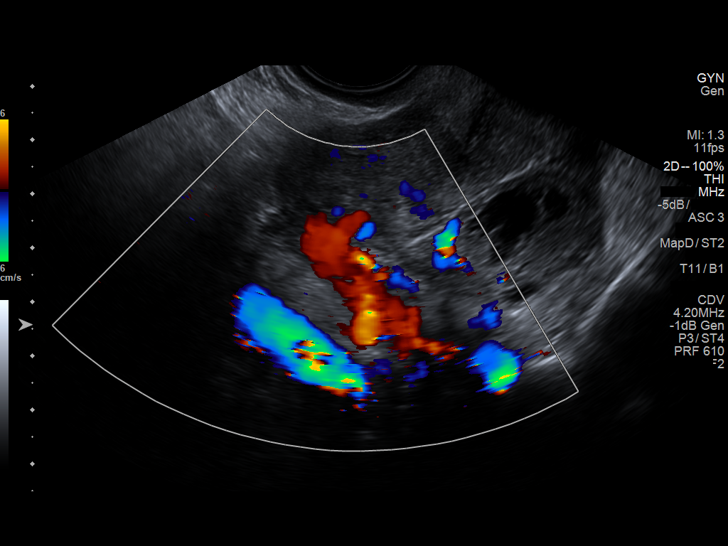
[im 46/56]
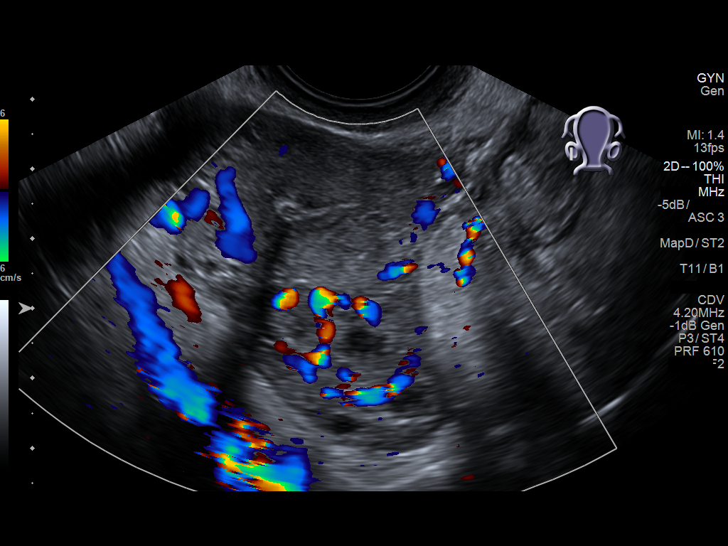
[im 51/56]
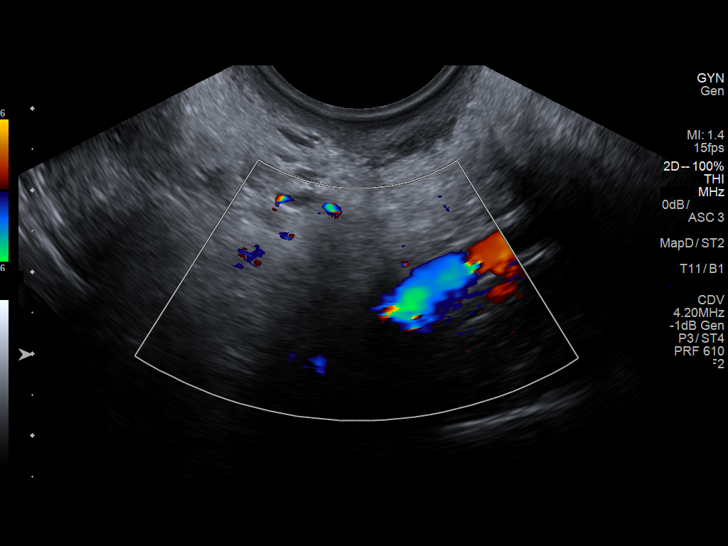
[im 56/56]
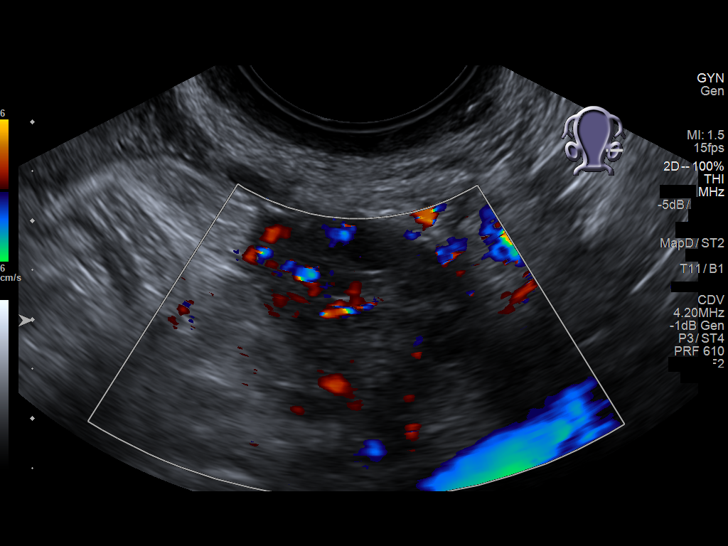

[13 of 25 positions shown; findings below may reference images not displayed]

FINDINGS: Uterus

Measurements: 7.0 x 3.5 x 4.2 cm. No fibroids or other mass
visualized.

Endometrium

Thickness: 4 mm.  An intrauterine device is noted.

Right ovary

Measurements: 3.5 x 1.9 x 2.6 cm.. There is a somewhat complex cyst
with irregular wall in the right ovary measuring 1.5 x 0.8 x 1.3 cm,
likely a hemorrhagic cyst. Follow-up with ultrasound after 2
menstrual cycles recommended.

Left ovary

Measurements: 2.8 x 1.5 x 1.9 cm.. Normal appearance/no adnexal
mass.

Other findings

Trace free fluid within the pelvis.
IMPRESSION: Somewhat complex, probably hemorrhagic right ovarian cyst. Follow-up
recommended.

Intrauterine device in place. Unremarkable uterus and the left
ovary.
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
## 2020-01-07 ENCOUNTER — Emergency Department
Admission: EM | Admit: 2020-01-07 | Discharge: 2020-01-08 | Disposition: A | Discharge status: Home / Self Care | Payer: Medicaid Other | Attending: Emergency Medicine | Admitting: Emergency Medicine

## 2020-01-07 ENCOUNTER — Other Ambulatory Visit: Payer: Self-pay

## 2020-01-07 DIAGNOSIS — F411 Generalized anxiety disorder: Secondary | ICD-10-CM | POA: Diagnosis present

## 2020-01-07 DIAGNOSIS — R45851 Suicidal ideations: Secondary | ICD-10-CM | POA: Diagnosis not present

## 2020-01-07 DIAGNOSIS — F17210 Nicotine dependence, cigarettes, uncomplicated: Secondary | ICD-10-CM | POA: Insufficient documentation

## 2020-01-07 DIAGNOSIS — F10129 Alcohol abuse with intoxication, unspecified: Secondary | ICD-10-CM | POA: Insufficient documentation

## 2020-01-07 DIAGNOSIS — N83201 Unspecified ovarian cyst, right side: Secondary | ICD-10-CM | POA: Diagnosis present

## 2020-01-07 DIAGNOSIS — F10929 Alcohol use, unspecified with intoxication, unspecified: Secondary | ICD-10-CM

## 2020-01-07 DIAGNOSIS — F419 Anxiety disorder, unspecified: Secondary | ICD-10-CM | POA: Diagnosis present

## 2020-01-07 DIAGNOSIS — Z20822 Contact with and (suspected) exposure to covid-19: Secondary | ICD-10-CM | POA: Insufficient documentation

## 2020-01-07 DIAGNOSIS — R1032 Left lower quadrant pain: Secondary | ICD-10-CM | POA: Diagnosis present

## 2020-01-07 DIAGNOSIS — F410 Panic disorder [episodic paroxysmal anxiety] without agoraphobia: Secondary | ICD-10-CM | POA: Diagnosis present

## 2020-01-07 LAB — CBC
HCT: 42.1 % (ref 36.0–46.0)
Hemoglobin: 14.9 g/dL (ref 12.0–15.0)
MCH: 31.5 pg (ref 26.0–34.0)
MCHC: 35.4 g/dL (ref 30.0–36.0)
MCV: 89 fL (ref 80.0–100.0)
Platelets: 356 10*3/uL (ref 150–400)
RBC: 4.73 MIL/uL (ref 3.87–5.11)
RDW: 12.6 % (ref 11.5–15.5)
WBC: 6.6 10*3/uL (ref 4.0–10.5)
nRBC: 0 % (ref 0.0–0.2)

## 2020-01-07 LAB — COMPREHENSIVE METABOLIC PANEL
ALT: 27 U/L (ref 0–44)
AST: 36 U/L (ref 15–41)
Albumin: 4.3 g/dL (ref 3.5–5.0)
Alkaline Phosphatase: 81 U/L (ref 38–126)
Anion gap: 9 (ref 5–15)
BUN: 9 mg/dL (ref 6–20)
CO2: 26 mmol/L (ref 22–32)
Calcium: 9.5 mg/dL (ref 8.9–10.3)
Chloride: 109 mmol/L (ref 98–111)
Creatinine, Ser: 0.84 mg/dL (ref 0.44–1.00)
GFR calc Af Amer: >60 mL/min (ref >60)
GFR calc non Af Amer: >60 mL/min (ref >60)
Glucose, Bld: 137 mg/dL — ABNORMAL HIGH (ref 70–99)
Potassium: 3.9 mmol/L (ref 3.5–5.1)
Sodium: 144 mmol/L (ref 135–145)
Total Bilirubin: 0.6 mg/dL (ref 0.3–1.2)
Total Protein: 7.7 g/dL (ref 6.5–8.1)

## 2020-01-07 LAB — URINE DRUG SCREEN, QUALITATIVE (ARMC ONLY)
Amphetamines, Ur Screen: NOT DETECTED
Barbiturates, Ur Screen: NOT DETECTED
Benzodiazepine, Ur Scrn: NOT DETECTED
Cannabinoid 50 Ng, Ur ~~LOC~~: NOT DETECTED
Cocaine Metabolite,Ur ~~LOC~~: NOT DETECTED
MDMA (Ecstasy)Ur Screen: NOT DETECTED
Methadone Scn, Ur: NOT DETECTED
Opiate, Ur Screen: NOT DETECTED
Phencyclidine (PCP) Ur S: NOT DETECTED
Tricyclic, Ur Screen: NOT DETECTED

## 2020-01-07 LAB — SALICYLATE LEVEL: Salicylate Lvl: <7 mg/dL — ABNORMAL LOW (ref 7.0–30.0)

## 2020-01-07 LAB — ACETAMINOPHEN LEVEL: Acetaminophen (Tylenol), Serum: <10 ug/mL — ABNORMAL LOW (ref 10–30)

## 2020-01-07 LAB — POCT PREGNANCY, URINE: Preg Test, Ur: NEGATIVE

## 2020-01-07 LAB — ETHANOL: Alcohol, Ethyl (B): 217 mg/dL — ABNORMAL HIGH (ref <10)

## 2020-01-07 LAB — SARS CORONAVIRUS 2 BY RT PCR (HOSPITAL ORDER, PERFORMED IN ~~LOC~~ HOSPITAL LAB): SARS Coronavirus 2: NEGATIVE

## 2020-01-07 MED ORDER — LORAZEPAM 2 MG/ML IJ SOLN
INTRAMUSCULAR | Status: AC
  Filled 2020-01-07: qty 1

## 2020-01-07 MED ORDER — NICOTINE 21 MG/24HR TD PT24
21.0000 mg | MEDICATED_PATCH | Freq: Once | TRANSDERMAL | Status: AC
  Administered 2020-01-07: 21 mg via TRANSDERMAL
  Filled 2020-01-07: qty 1

## 2020-01-07 MED ORDER — LORAZEPAM 0.5 MG PO TABS
0.5000 mg | ORAL_TABLET | Freq: Once | ORAL | Status: AC
  Administered 2020-01-07: 0.5 mg via ORAL
  Filled 2020-01-07: qty 1

## 2020-01-07 MED ORDER — DIPHENHYDRAMINE HCL 50 MG/ML IJ SOLN: 25.0000 mg | Freq: Once | INTRAMUSCULAR | Status: DC

## 2020-01-07 MED ORDER — LORAZEPAM 1 MG PO TABS
1.0000 mg | ORAL_TABLET | Freq: Three times a day (TID) | ORAL | Status: DC | PRN
  Administered 2020-01-07 – 2020-01-08 (×2): 1 mg via ORAL
  Filled 2020-01-07 (×2): qty 1

## 2020-01-07 MED ORDER — HALOPERIDOL LACTATE 5 MG/ML IJ SOLN
5.0000 mg | Freq: Once | INTRAMUSCULAR | Status: DC
Start: 2020-01-07 — End: 2020-01-08

## 2020-01-07 MED ORDER — DIPHENHYDRAMINE HCL 50 MG/ML IJ SOLN
INTRAMUSCULAR | Status: AC
  Filled 2020-01-07: qty 1

## 2020-01-07 MED ORDER — LORAZEPAM 2 MG/ML IJ SOLN: 1.0000 mg | Freq: Once | INTRAMUSCULAR | Status: DC

## 2020-01-07 MED ORDER — HALOPERIDOL LACTATE 5 MG/ML IJ SOLN
INTRAMUSCULAR | Status: AC
  Filled 2020-01-07: qty 1

## 2020-01-07 MED ORDER — FLUOXETINE HCL 20 MG PO CAPS
20.0000 mg | ORAL_CAPSULE | Freq: Every day | ORAL | Status: DC
  Administered 2020-01-08: 20 mg via ORAL
  Filled 2020-01-07: qty 1

## 2020-01-07 MED ORDER — TRAZODONE HCL 100 MG PO TABS
100.0000 mg | ORAL_TABLET | Freq: Every day | ORAL | Status: DC
  Administered 2020-01-07: 100 mg via ORAL
  Filled 2020-01-07: qty 1

## 2020-01-07 NOTE — ED Provider Notes (Signed)
[facility] Emergency Department Provider Note  ____________________________________________   First MD Initiated Contact with Patient 01/07/20 [phone]     (approximate)  I have reviewed the triage vital signs and the nursing notes.  Level 5 caveat history review of system and physical exam limited secondary to patient's unwillingness to answer questions and aggressive behavior HISTORY  Chief Complaint Suicidal    HPI Diamond Tyler is a 34 y.o. female presents to the emergency department via EMS and police yelling and cursing at staff on my arrival to the room.  Per police patient had stated suicidal ideation.  Patient very irate and unwilling to answer any questions for staff.  Patient states to me before I made any statements to the patient "yeah I've had suicidal thoughts and yes I have a gun in the house but I did not use it, Fuck you, fuck you, get the fuck out".        No past medical history on file.  Patient Active Problem List   Diagnosis Date Noted  . Abdominal pain, left lower quadrant 01/06/2016  . Right ovarian cyst 01/06/2016    Past Surgical History:  Procedure Laterality Date  . LAPAROSCOPY N/A 01/06/2016   Procedure: LAPAROSCOPY DIAGNOSTIC, lysis of adhesion;  Surgeon: Conard Novak, MD;  Location: [facility] ORS;  Service: Gynecology;  Laterality: N/A;    Prior to Admission medications   Medication Sig Start Date End Date Taking? Authorizing Provider  cyclobenzaprine (FLEXERIL) 5 MG tablet Take 1 tablet (5 mg total) by mouth 3 (three) times daily as needed for muscle spasms. 05/30/16   Orvil Feil, PA-C  oxyCODONE-acetaminophen (ROXICET) 5-325 MG tablet Take 2 tablets by mouth every 6 (six) hours as needed for moderate pain or severe pain. 01/06/16   Conard Novak, MD    Allergies Fish allergy, Iodine, Ivp dye [iodinated diagnostic agents], and Motrin [ibuprofen]  No family history on file.  Social History Social  History   Tobacco Use  . Smoking status: Current Every Day Smoker    Types: Cigarettes  Substance Use Topics  . Alcohol use: Yes    Alcohol/week: 4.0 standard drinks    Types: 4 Cans of beer per week    Comment: 12 pack per week.  . Drug use: Not on file    Review of Systems Constitutional: No fever/chills Eyes: No visual changes. ENT: No sore throat. Cardiovascular: Denies chest pain. Respiratory: Denies shortness of breath. Gastrointestinal: No abdominal pain.  No nausea, no vomiting.  No diarrhea.  No constipation. Genitourinary: Negative for dysuria. Musculoskeletal: Negative for neck pain.  Negative for back pain. Integumentary: Negative for rash. Neurological: Negative for headaches, focal weakness or numbness. Psychiatric:  Positive for stated suicidal ideation   ____________________________________________   PHYSICAL EXAM:  VITAL SIGNS: ED Triage Vitals  Enc Vitals Group     BP 01/07/20 0330 (!) 156/97     Pulse Rate 01/07/20 0330 (!) 134     Resp 01/07/20 0330 (!) 22     Temp 01/07/20 0330 100 F (37.8 C)     Temp Source 01/07/20 0330 Oral     SpO2 01/07/20 0330 96 %     Weight 01/07/20 0255 59 kg (130 lb)     Height 01/07/20 0255 1.524 m (5')     Head Circumference --      Peak Flow --      Pain Score 01/07/20 0255 0     Pain Loc --  Pain Edu? --      Excl. in [place]? --     Constitutional: Alert and oriented.  irate.  Appears intoxicated Eyes: Conjunctivae are normal.  Head: Atraumatic. Mouth/Throat: Patient is wearing a mask. Neck: No stridor.   Musculoskeletal: No lower extremity tenderness nor edema. No gross deformities of extremities. Neurologic:  Normal speech and language. No gross focal neurologic deficits are appreciated.  Skin:  Skin is warm, dry and intact. Psychiatric: Irate, verbally abusive to staff   ____________________________________________   LABS (all labs ordered are listed, but only abnormal results are  displayed)  Labs Reviewed  COMPREHENSIVE METABOLIC PANEL - Abnormal; Notable for the following components:      Result Value   Glucose, Bld 137 (*)    All other components within normal limits  ETHANOL - Abnormal; Notable for the following components:   Alcohol, Ethyl (B) 217 (*)    All other components within normal limits  SALICYLATE LEVEL - Abnormal; Notable for the following components:   Salicylate Lvl <2.9 (*)    All other components within normal limits  ACETAMINOPHEN LEVEL - Abnormal; Notable for the following components:   Acetaminophen (Tylenol), Serum <10 (*)    All other components within normal limits  CBC  URINE DRUG SCREEN, QUALITATIVE ([facility] ONLY)  POC URINE PREG, ED     Procedures   ____________________________________________   INITIAL IMPRESSION / MDM / ASSESSMENT AND PLAN / ED COURSE  As part of my medical decision making, I reviewed the following data within the electronic MEDICAL RECORD NUMBER   34 year old female presented with above-stated history and physical exam secondary to suicidal ideation.  On EMS encoding we were notified that the patient was involuntarily committed however the patient was not.  Given stated suicidal ideation patient was involuntarily committed here in the emergency department.  Laboratory data notable for alcohol level of 217.  Await psychiatry evaluation and disposition  ____________________________________________  FINAL CLINICAL IMPRESSION(S) / ED DIAGNOSES  Final diagnoses:  Suicidal ideation  Alcoholic intoxication with complication ([place])     MEDICATIONS GIVEN DURING THIS VISIT:  Medications  haloperidol lactate (HALDOL) injection 5 mg (has no administration in time range)  LORazepam (ATIVAN) injection 1 mg (has no administration in time range)  diphenhydrAMINE (BENADRYL) injection 25 mg (has no administration in time range)  diphenhydrAMINE (BENADRYL) 50 MG/ML injection (has no administration in time range)   haloperidol lactate (HALDOL) 5 MG/ML injection (has no administration in time range)  LORazepam (ATIVAN) 2 MG/ML injection (has no administration in time range)     ED Discharge Orders    None      *Please note:  Diamond Tyler was evaluated in Emergency Department on 01/07/2020 for the symptoms described in the history of present illness. She was evaluated in the context of the global COVID-19 pandemic, which necessitated consideration that the patient might be at risk for infection with the SARS-CoV-2 virus that causes COVID-19. Institutional protocols and algorithms that pertain to the evaluation of patients at risk for COVID-19 are in a state of rapid change based on information released by regulatory bodies including the CDC and federal and state organizations. These policies and algorithms were followed during the patient's care in the ED.  Some ED evaluations and interventions may be delayed as a result of limited staffing during and after the pandemic.*  Note:  This document was prepared using Dragon voice recognition software and may include unintentional dictation errors.   Gregor Hams, MD 01/07/20 [phone]

## 2020-01-07 NOTE — ED Notes (Signed)
Spoke to sister who is coming to visit due to being given the wrong visiting hours. Allowing a 15 min visit.

## 2020-01-07 NOTE — ED Triage Notes (Signed)
Pt brought in via ems from home.  Pt reports she has been having thoughts of hurting herself tonight.  Pt yelling and cursing at staff, tearful at times.  md and nurse at bedside on arrival to treatment room.  Pt uncooperative at this time.

## 2020-01-07 NOTE — ED Notes (Signed)
Pt denies SI/HI/AVH on assessment. She reports she has been increasingly anxious for the last month and yesterday was the worst. She reports feeling much better today.
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
## 2020-01-07 NOTE — ED Notes (Signed)
Pt given phone to use 

## 2020-01-07 NOTE — ED Notes (Signed)
Pt resting in NAD in bed. Waiting for psychiatrist.

## 2020-01-07 NOTE — ED Notes (Signed)
Pt given supper meal tray. 

## 2020-01-07 NOTE — Consult Note (Signed)
[facility] Face-to-Face Psychiatry Consult   Reason for Consult:Suicidal Referring Physician: Dr. Manson Passey Patient Identification: Diamond Tyler MRN:  [id number] Principal Diagnosis: <principal problem not specified> Diagnosis:  Active Problems:   Abdominal pain, left lower quadrant   Right ovarian cyst   Generalized anxiety disorder with panic attacks   Total Time spent with patient: 30 minutes  Subjective: "I know my behavior last night was out of character, and I apologize." Diamond Tyler is a 34 y.o. female patient presented to [facility] ED via law Enforcement under Involuntary Commitment status (IVC). Per the triage nurse note, the patient reports she has been having thoughts of hurting herself tonight.  The patient is physically aggressive towards staff, police officer, attempting to leave the ED, yelling and cursing at staff, and tearful at times. The patient's BAL was 217 mg/dl. The patient was seen face-to-face by this provider; the chart was reviewed and consulted with Dr. Scotty Court on 01/07/2020 due to the patient's care. It was discussed with the EDP that the patient does not meet the criteria to be admitted to the psychiatric inpatient unit. The patient will resume her home medication.  Once collateral has been obtained and the patient is safe in her community, IVC may be rescinded, and the patient can be discharged.   The patient is alert, oriented x4, calm, cooperative, and mood-congruent with affect on evaluation. The patient does not appear to be responding to internal or external stimuli. Neither is the patient presenting with any delusional thinking. The patient denies auditory or visual hallucinations. The patient denies any suicidal, homicidal, or self-harm ideations. The patient is not presenting with any psychotic or paranoid behaviors. During an encounter with the patient, she was able to answer questions appropriately. Collateral was not obtained due to the person stating it is the  wrong no. Plan: The patient is not a safety risk to herself or others and is not requiring psychiatric inpatient admission for stabilization and treatment.  HPI: Per Dr. Manson Passey: Diamond Tyler is a 34 y.o. female presents to the emergency department via EMS and police yelling and cursing at staff on my arrival to the room.  Per police patient had stated suicidal ideation.  Patient very irate and unwilling to answer any questions for staff.  Patient states to me before I made any statements to the patient "yeah I've had suicidal thoughts and yes I have a gun in the house but I did not use it, Fuck you, fuck you, get the fuck out".  Past Psychiatric History: No pertinent past psychiatric history  Risk to Self:   No Risk to Others:   No Prior Inpatient Therapy:   Yes Prior Outpatient Therapy:   Yes  Past Medical History: No past medical history on file.  Past Surgical History:  Procedure Laterality Date  . LAPAROSCOPY N/A 01/06/2016   Procedure: LAPAROSCOPY DIAGNOSTIC, lysis of adhesion;  Surgeon: Conard Novak, MD;  Location: [facility] ORS;  Service: Gynecology;  Laterality: N/A;   Family History: No family history on file. Family Psychiatric  History:  Social History:  Social History   Substance and Sexual Activity  Alcohol Use Yes  . Alcohol/week: 4.0 standard drinks  . Types: 4 Cans of beer per week   Comment: 12 pack per week.     Social History   Substance and Sexual Activity  Drug Use Not on file    Social History   Socioeconomic History  . Marital status: Single    Spouse name: Not on file  .  Number of children: Not on file  . Years of education: Not on file  . Highest education level: Not on file  Occupational History  . Not on file  Tobacco Use  . Smoking status: Current Every Day Smoker    Types: Cigarettes  Substance and Sexual Activity  . Alcohol use: Yes    Alcohol/week: 4.0 standard drinks    Types: 4 Cans of beer per week    Comment: 12 pack per week.  .  Drug use: Not on file  . Sexual activity: Not on file  Other Topics Concern  . Not on file  Social History Narrative  . Not on file   Social Determinants of Health   Financial Resource Strain:   . Difficulty of Paying Living Expenses:   Food Insecurity:   . Worried About Programme researcher, broadcasting/film/video in the Last Year:   . Barista in the Last Year:   Transportation Needs:   . Freight forwarder (Medical):   Marland Kitchen Lack of Transportation (Non-Medical):   Physical Activity:   . Days of Exercise per Week:   . Minutes of Exercise per Session:   Stress:   . Feeling of Stress :   Social Connections:   . Frequency of Communication with Friends and Family:   . Frequency of Social Gatherings with Friends and Family:   . Attends Religious Services:   . Active Member of Clubs or Organizations:   . Attends Banker Meetings:   Marland Kitchen Marital Status:    Additional Social History:    Allergies:   Allergies  Allergen Reactions  . Fish Allergy Nausea And Vomiting  . Iodine Nausea And Vomiting  . Ivp Dye [Iodinated Diagnostic Agents] Nausea And Vomiting  . Motrin [Ibuprofen] Itching    Labs:  Results for orders placed or performed during the hospital encounter of 01/07/20 (from the past 48 hour(s))  Comprehensive metabolic panel     Status: Abnormal   Collection Time: 01/07/20  3:36 AM  Result Value Ref Range   Sodium 144 135 - 145 mmol/L   Potassium 3.9 3.5 - 5.1 mmol/L   Chloride 109 98 - 111 mmol/L   CO2 26 22 - 32 mmol/L   Glucose, Bld 137 (H) 70 - 99 mg/dL    Comment: Glucose reference range applies only to samples taken after fasting for at least 8 hours.   BUN 9 6 - 20 mg/dL   Creatinine, Ser 2.35 0.44 - 1.00 mg/dL   Calcium 9.5 8.9 - 57.3 mg/dL   Total Protein 7.7 6.5 - 8.1 g/dL   Albumin 4.3 3.5 - 5.0 g/dL   AST 36 15 - 41 U/L   ALT 27 0 - 44 U/L   Alkaline Phosphatase 81 38 - 126 U/L   Total Bilirubin 0.6 0.3 - 1.2 mg/dL   GFR calc non Af Amer >60 >60 mL/min    GFR calc Af Amer >60 >60 mL/min   Anion gap 9 5 - 15    Comment: Performed at [facility], [address] [place], [place] [address]  Ethanol     Status: Abnormal   Collection Time: 01/07/20  3:36 AM  Result Value Ref Range   Alcohol, Ethyl (B) 217 (H) <10 mg/dL    Comment: (NOTE) Lowest detectable limit for serum alcohol is 10 mg/dL.  For medical purposes only. Performed at [facility], [address], [place], [place] [address]   Salicylate level  Status: Abnormal   Collection Time: 01/07/20  3:36 AM  Result Value Ref Range   Salicylate Lvl <3.2 (L) 7.0 - 30.0 mg/dL    Comment: Performed at [facility], [place], [place] [address]  Acetaminophen level     Status: Abnormal   Collection Time: 01/07/20  3:36 AM  Result Value Ref Range   Acetaminophen (Tylenol), Serum <10 (L) 10 - 30 ug/mL    Comment: (NOTE) Therapeutic concentrations vary significantly. A range of 10-30 ug/mL  may be an effective concentration for many patients. However, some  are best treated at concentrations outside of this range. Acetaminophen concentrations >150 ug/mL at 4 hours after ingestion  and >50 ug/mL at 12 hours after ingestion are often associated with  toxic reactions.  Performed at [facility], [address] [place], [place] [address]   cbc     Status: None   Collection Time: 01/07/20  3:36 AM  Result Value Ref Range   WBC 6.6 4.0 - 10.5 K/uL   RBC 4.73 3.87 - 5.11 MIL/uL   Hemoglobin 14.9 12.0 - 15.0 g/dL   HCT 42.1 36 - 46 %   MCV 89.0 80.0 - 100.0 fL   MCH 31.5 26.0 - 34.0 pg   MCHC 35.4 30.0 - 36.0 g/dL   RDW 12.6 11.5 - 15.5 %   Platelets 356 150 - 400 K/uL   nRBC 0.0 0.0 - 0.2 %    Comment: Performed at [facility], [address], [place], [place] [address]  Urine Drug Screen, Qualitative     Status: None   Collection Time: 01/07/20  3:36 AM  Result Value Ref Range   Tricyclic, Ur Screen NONE  DETECTED NONE DETECTED   Amphetamines, Ur Screen NONE DETECTED NONE DETECTED   MDMA (Ecstasy)Ur Screen NONE DETECTED NONE DETECTED   Cocaine Metabolite,Ur [place] NONE DETECTED NONE DETECTED   Opiate, Ur Screen NONE DETECTED NONE DETECTED   Phencyclidine (PCP) Ur S NONE DETECTED NONE DETECTED   Cannabinoid 50 Ng, Ur [place] NONE DETECTED NONE DETECTED   Barbiturates, Ur Screen NONE DETECTED NONE DETECTED   Benzodiazepine, Ur Scrn NONE DETECTED NONE DETECTED   Methadone Scn, Ur NONE DETECTED NONE DETECTED    Comment: (NOTE) Tricyclics + metabolites, urine    Cutoff 1000 ng/mL Amphetamines + metabolites, urine  Cutoff 1000 ng/mL MDMA (Ecstasy), urine              Cutoff 500 ng/mL Cocaine Metabolite, urine          Cutoff 300 ng/mL Opiate + metabolites, urine        Cutoff 300 ng/mL Phencyclidine (PCP), urine         Cutoff 25 ng/mL Cannabinoid, urine                 Cutoff 50 ng/mL Barbiturates + metabolites, urine  Cutoff 200 ng/mL Benzodiazepine, urine              Cutoff 200 ng/mL Methadone, urine                   Cutoff 300 ng/mL  The urine drug screen provides only a preliminary, unconfirmed analytical test result and should not be used for non-medical purposes. Clinical consideration and professional judgment should be applied to any positive drug screen result due to possible interfering substances. A more specific alternate chemical method must be used in order to obtain a confirmed analytical result. Gas chromatography / mass spectrometry (  GC/MS) is the preferred confirm atory method. Performed at [facility], [address] [place], [place] [address]   Pregnancy, urine POC     Status: None   Collection Time: 01/07/20  5:42 AM  Result Value Ref Range   Preg Test, Ur NEGATIVE NEGATIVE    Comment:        THE SENSITIVITY OF THIS METHODOLOGY IS >24 mIU/mL   SARS Coronavirus 2 by RT PCR (hospital order, performed in [facility] hospital lab) Nasopharyngeal Nasopharyngeal  Swab     Status: None   Collection Time: 01/07/20  6:29 PM   Specimen: Nasopharyngeal Swab  Result Value Ref Range   SARS Coronavirus 2 NEGATIVE NEGATIVE    Comment: (NOTE) SARS-CoV-2 target nucleic acids are NOT DETECTED.  The SARS-CoV-2 RNA is generally detectable in upper and lower respiratory specimens during the acute phase of infection. The lowest concentration of SARS-CoV-2 viral copies this assay can detect is 250 copies / mL. A negative result does not preclude SARS-CoV-2 infection and should not be used as the sole basis for treatment or other patient management decisions.  A negative result may occur with improper specimen collection / handling, submission of specimen other than nasopharyngeal swab, presence of viral mutation(s) within the areas targeted by this assay, and inadequate number of viral copies (<250 copies / mL). A negative result must be combined with clinical observations, patient history, and epidemiological information.  Fact Sheet for Patients:   BoilerBrush.com.cyhttps://www.fda.gov/media/136312/download  Fact Sheet for Healthcare Providers: https://pope.com/https://www.fda.gov/media/136313/download  This test is not yet approved or  cleared by the [place] FDA and has been authorized for detection and/or diagnosis of SARS-CoV-2 by FDA under an Emergency Use Authorization (EUA).  This EUA will remain in effect (meaning this test can be used) for the duration of the COVID-19 declaration under Section 564(b)(1) of the Act, 21 U.S.C. section 360bbb-3(b)(1), unless the authorization is terminated or revoked sooner.  Performed at [facility], [address] [place], [place] [address]     Current Facility-Administered Medications  Medication Dose Route Frequency Provider Last Rate Last Admin  . diphenhydrAMINE (BENADRYL) injection 25 mg  25 mg Intramuscular Once Darci CurrentBrown, [place] N, MD      . haloperidol lactate (HALDOL) injection 5 mg  5 mg Intramuscular Once Darci CurrentBrown,  [place] N, MD      . LORazepam (ATIVAN) injection 1 mg  1 mg Intramuscular Once Darci CurrentBrown, [place] N, MD      . LORazepam (ATIVAN) tablet 1 mg  1 mg Oral TID PRN Gillermo Murdochhompson, Jerrine Urschel, NP   1 mg at 01/07/20 2144  . nicotine (NICODERM CQ - dosed in mg/24 hours) patch 21 mg  21 mg Transdermal Once Darci CurrentBrown, [place] N, MD   21 mg at 01/07/20 0527  . traZODone (DESYREL) tablet 100 mg  100 mg Oral QHS Gillermo Murdochhompson, [place]i Mcmanus, NP   100 mg at 01/07/20 2144   Current Outpatient Medications  Medication Sig Dispense Refill  . cyclobenzaprine (FLEXERIL) 5 MG tablet Take 1 tablet (5 mg total) by mouth 3 (three) times daily as needed for muscle spasms. (Patient not taking: Reported on 01/07/2020) 9 tablet 0  . FLUoxetine (PROZAC) 20 MG capsule Take 20 mg by mouth daily. (Patient not taking: Reported on 01/07/2020)    . oxyCODONE-acetaminophen (ROXICET) 5-325 MG tablet Take 2 tablets by mouth every 6 (six) hours as needed for moderate pain or severe pain. (Patient not taking: Reported on 01/07/2020) 30 tablet 0    Musculoskeletal: Strength &  Muscle Tone: within normal limits Gait & Station: normal Patient leans: N/A  Psychiatric Specialty Exam: Physical Exam  Constitutional: She is cooperative.  Psychiatric: Her speech is normal and behavior is normal. Memory and judgment normal. Her mood appears anxious. She exhibits a depressed mood. She has a flat affect.    Review of Systems  Psychiatric/Behavioral: Positive for sleep disturbance. The patient is nervous/anxious.     Blood pressure (!) 144/86, pulse 83, temperature 98 F (36.7 C), temperature source Oral, resp. rate 18, height 5' (1.524 m), weight 59 kg, SpO2 100 %.Body mass index is 25.39 kg/m.  General Appearance: Casual  Eye Contact:  Fair  Speech:  Clear and Coherent  Volume:  Normal  Mood:  Anxious and Depressed  Affect:  Blunt and Depressed  Thought Process:  Coherent  Orientation:  Full (Time, Place, and Person)  Thought Content:  Logical   Suicidal Thoughts:  No  Homicidal Thoughts:  No  Memory:  Immediate;   Fair Recent;   Good Remote;   Good  Judgement:  Fair  Insight:  Lacking  Psychomotor Activity:  Normal  Concentration:  Concentration: Good  Recall:  Good  Fund of Knowledge:  Fair  Language:  Good  Akathisia:  Negative  Handed:  Right  AIMS (if indicated):     Assets:  Communication Skills Desire for Improvement Social Support  ADL's:  Intact  Cognition:  WNL  Sleep:    Insomnia     Treatment Plan Summary: Daily contact with patient to assess and evaluate symptoms and progress in treatment, Medication management and Plan Patient does not meet criteria for psychiatric inpatient admission.  Prozac 20 mg by mouth daily Ativan 1 mg po q 8 hrs prn for increase anxiety Trazodone 100 mg po q hs, prn  Disposition: No evidence of imminent risk to self or others at present.   Patient does not meet criteria for psychiatric inpatient admission. Supportive therapy provided about ongoing stressors. Refer to IOP. Discussed crisis plan, support from social network, calling 911, coming to the Emergency Department, and calling Suicide Hotline.  Gillermo Murdoch, NP 01/07/2020 10:14 PM

## 2020-01-07 NOTE — ED Notes (Signed)

## 2020-01-08 NOTE — ED Notes (Signed)
Pt discharging home. Discharge teaching and after care follow-up reviewed with pt and she verbalized understanding. Pt signed discharge paper form. Given all her personal belongings. Escorted to lobby, ambulatory and in NAD.

## 2020-01-08 NOTE — ED Notes (Signed)
Pt denies SI/HI/AVH on assessment. Endorses anxiety and wanting to speak with psychiatrist to get help with adjusting her medications. Advised he will be in to see her later. Given Ativan PRN per orders.

## 2020-01-08 NOTE — BH Assessment (Signed)
Writer spoke with the patient to complete an updated/reassessment. Patient denies SI/HI and AV/H. Patient does not meet inpatient criteria. Per EDP, pt. is appropriate for discharge. Pt. Reported her plan is to follow up with [facility].

## 2020-01-08 NOTE — Final Progress Note (Signed)
Physician Final Progress Note  Patient ID: Diamond Tyler MRN: [id number] DOB/AGE: [birth year] 33 y.o.  Admit date: 01/07/2020 Admitting provider:  ED MD and Psych MD Discharge date: 01/08/2020   Admission Diagnoses:  ETOH intoxication Panic disorder generalized anxiety and Major depresion Discharge Diagnoses:  Active Problems:   Abdominal pain, left lower quadrant   Right ovarian cyst   Generalized anxiety disorder with panic attacks    Consults:  TTS Psych ER MD and ED MD   Significant Findings/ Diagnostic Studies: --None new    Procedures: None   Discharge Condition: faor    Disposition: home to family pending [facility] return   Mental Status   Patient has cleared from her acute intoxication Oriented to person place date and time  Consciousness not clouded or fluctuant Mood somewhat better At baseline with anxiety  No active SI HI or plans contracts for safety No frank psychosis or mania  No other movements, shakes tremors  Concentration and attention normal Speech not slurred --normal rate tone and volume      Diet: {as tolerated   Discharge Activity: ---[facility] 12 step day treament, groups,  Community mental health   Go back to school,  Vocational rehab   Service work        Total time spent taking care of this patient:  Signed: Roselind Messier 01/08/2020, 12:46 PM

## 2020-01-08 NOTE — ED Provider Notes (Signed)
Emergency Medicine Observation Re-evaluation Note  Aala Ransom is a 34 y.o. female, seen on rounds today.  Pt initially presented to the ED for complaints of Suicidal  Currently, the patient is sleeping, but awakens easily, calm and cooperative, no acute complaints.  Physical Exam  BP (!) 144/86 (BP Location: Left Arm)    Pulse 83    Temp 98 F (36.7 C) (Oral)    Resp 18    Ht 5' (1.524 m)    Wt 59 kg    SpO2 100%    BMI 25.39 kg/m  Physical Exam  General: Awake and alert, clinically sober Head: [place]/AT Respiratory: Normal work of breathing Cardiac: Extremities well perfused MSK: No deformities Neurological: No focal neurological deficits Psych: Calm and cooperative   ED Course / MDM  I have reviewed the labs performed to date as well as medications administered while in observation.  Initial alcohol level on arrival on 6/23 around 3:30 AM was 217.  Clinically sober on my evaluation this morning.  Receiving scheduled medications.  Plan  Current plan is for reassessment psychiatry for final disposition. Patient is under full IVC at this time.   Miguel Aschoff., MD 01/08/20 1010

## 2020-01-08 NOTE — ED Notes (Signed)
Asked pt if she would like to shower this morning pt refused.

## 2020-01-08 NOTE — ED Provider Notes (Signed)
The patient has been evaluated at bedside by Dr. Smith Robert psychiatry.  Patient is clinically stable.  Not felt to be a danger to self or others.  No SI or Hi.  No indication for inpatient psychiatric admission at this time.  Appropriate for continued outpatient therapy.    Willy Eddy, MD 01/08/20 1336

## 2020-01-08 NOTE — ED Notes (Signed)
Pt talking to the psychiatrist at this time

## 2022-06-16 ENCOUNTER — Encounter: Payer: Self-pay | Admitting: Advanced Practice Midwife

## 2022-06-16 ENCOUNTER — Ambulatory Visit (LOCAL_COMMUNITY_HEALTH_CENTER): Payer: Medicaid Other | Admitting: Advanced Practice Midwife

## 2022-06-16 VITALS — BP 111/78 | HR 96 | Temp 97.1°F | Ht 60.0 in | Wt 147.6 lb

## 2022-06-16 DIAGNOSIS — F1290 Cannabis use, unspecified, uncomplicated: Secondary | ICD-10-CM

## 2022-06-16 DIAGNOSIS — Z62810 Personal history of physical and sexual abuse in childhood: Secondary | ICD-10-CM

## 2022-06-16 DIAGNOSIS — Z30430 Encounter for insertion of intrauterine contraceptive device: Secondary | ICD-10-CM

## 2022-06-16 DIAGNOSIS — T1491XA Suicide attempt, initial encounter: Secondary | ICD-10-CM

## 2022-06-16 DIAGNOSIS — E663 Overweight: Secondary | ICD-10-CM | POA: Insufficient documentation

## 2022-06-16 DIAGNOSIS — F4310 Post-traumatic stress disorder, unspecified: Secondary | ICD-10-CM

## 2022-06-16 DIAGNOSIS — T7412XS Child physical abuse, confirmed, sequela: Secondary | ICD-10-CM

## 2022-06-16 DIAGNOSIS — Z30432 Encounter for removal of intrauterine contraceptive device: Secondary | ICD-10-CM

## 2022-06-16 DIAGNOSIS — Z720 Tobacco use: Secondary | ICD-10-CM

## 2022-06-16 DIAGNOSIS — B9689 Other specified bacterial agents as the cause of diseases classified elsewhere: Secondary | ICD-10-CM

## 2022-06-16 DIAGNOSIS — T7421XA Adult sexual abuse, confirmed, initial encounter: Secondary | ICD-10-CM | POA: Insufficient documentation

## 2022-06-16 DIAGNOSIS — T7421XS Adult sexual abuse, confirmed, sequela: Secondary | ICD-10-CM

## 2022-06-16 DIAGNOSIS — Z7289 Other problems related to lifestyle: Secondary | ICD-10-CM

## 2022-06-16 DIAGNOSIS — F17210 Nicotine dependence, cigarettes, uncomplicated: Secondary | ICD-10-CM

## 2022-06-16 DIAGNOSIS — Z3009 Encounter for other general counseling and advice on contraception: Secondary | ICD-10-CM

## 2022-06-16 DIAGNOSIS — Z309 Encounter for contraceptive management, unspecified: Secondary | ICD-10-CM | POA: Diagnosis not present

## 2022-06-16 DIAGNOSIS — F32A Depression, unspecified: Secondary | ICD-10-CM

## 2022-06-16 DIAGNOSIS — T7412XA Child physical abuse, confirmed, initial encounter: Secondary | ICD-10-CM | POA: Insufficient documentation

## 2022-06-16 LAB — HM HEPATITIS C SCREENING LAB: HM Hepatitis Screen: NEGATIVE

## 2022-06-16 LAB — WET PREP FOR TRICH, YEAST, CLUE
Trichomonas Exam: NEGATIVE
Yeast Exam: NEGATIVE

## 2022-06-16 LAB — HM HIV SCREENING LAB: HM HIV Screening: NEGATIVE

## 2022-06-16 MED ORDER — METRONIDAZOLE 500 MG PO TABS: 500.0000 mg | ORAL_TABLET | Freq: Two times a day (BID) | ORAL | 0 refills | Status: AC

## 2022-06-16 MED ORDER — LEVONORGESTREL 20.1 MCG/DAY IU IUD
1.0000 | INTRAUTERINE_SYSTEM | Freq: Once | INTRAUTERINE | Status: AC
  Administered 2022-06-16: 1 via INTRAUTERINE

## 2022-06-16 MED ORDER — LEVONORGESTREL 20.1 MCG/DAY IU IUD: 1.0000 | INTRAUTERINE_SYSTEM | Freq: Once | INTRAUTERINE | Status: DC

## 2022-06-16 NOTE — Progress Notes (Signed)
[facility] [place] Number: [phone]    Family Planning Visit- Initial Visit  Subjective:  Diamond Tyler is a 36 y.o.  MWF vaper [id number] (16, 14)   being seen today for an initial annual visit and to discuss reproductive life planning.  The patient is currently using IUD or IUS for pregnancy prevention. Patient reports   does not want a pregnancy in the next year.     report they are looking for a method that provides High efficacy at preventing pregnancy  Patient has the following medical conditions has Abdominal pain, left lower quadrant; Right ovarian cyst; Generalized anxiety disorder with panic attacks; and Overweight BMI=28.8 on their problem list.  Chief Complaint  Patient presents with   Annual Exam    Patient reports here for physical and Mirena removal and reinsertion. Mirena inserted 7 years ago. No menses with IUD. Last sex 06/08/22 without condom; with current partner x 16 years; 1 partner in last 3 mo. Last dental apt 8 years ago. Last pap [facility] 2022 per pt but not able to see in Epic. Pap 05/04/14 neg HPV neg. Last cig  6 years ago. Currently vaping. Last MJ 06/13/22. Uses mushrooms daily 0.3 capsules for anxiety/depression dx'd 9 years ago. States she has counseling 1x/mo. Last ETOH last night (1 beer) and has 6-12 12 oz beers/wk. Employed 8 hrs/wk and living with her mom, her son and daughter. Hx suicide attempt 01/07/20 with alcohol intoxication and put a gun to her head and hospitalized x 3 days.   The medication you are ordering is associated with Risk Evaluation and Mitigation Strategies (REMS) and has Elements to [employer] (ETASU).   Please use the link below to the FDA website to assure all ETASU are addressed for this medication.    Patient denies any problems today  Body mass index is 28.83 kg/m. - Patient is eligible for diabetes screening based on BMI and age  [id number]?  not applicable [id number] ordered? not applicable  Patient reports 1  partner/s in last year. Desires STI screening?  Yes  Has patient been screened once for HCV in the past?  No  No results found for: "HCVAB"  Does the patient have current drug use (including MJ), have a partner with drug use, and/or has been incarcerated since last result? Yes  If yes-- Screen for HCV through [facility]   Does the patient meet criteria for HBV testing? Yes  Criteria:  -Household, sexual or needle sharing contact with HBV -History of drug use -HIV positive -Those with known Hep C   Health Maintenance Due  Topic Date Due   COVID-19 Vaccine (1) Never done   HIV Screening  Never done   Hepatitis C Screening  Never done   DTaP/Tdap/Td (1 - Tdap) Never done   PAP SMEAR-Modifier  Never done   INFLUENZA VACCINE  Never done    Review of Systems  Constitutional:  Positive for weight loss (30 lb wt gain in past 2 years; not exercising; suggestions given).  Neurological:  Positive for headaches (1x/wk frontal relieved with Tylenol).  All other systems reviewed and are negative.   The following portions of the patient's history were reviewed and updated as appropriate: allergies, current medications, past family history, past medical history, past social history, past surgical history and problem list. Problem list updated.   See flowsheet for other program required questions.  Objective:   Vitals:  06/16/22 1457  BP: 111/78  Pulse: 96  Temp: (!) 97.1 F (36.2 C)  Weight: 147 lb 9.6 oz (67 kg)  Height: 5' (1.524 m)    Physical Exam Constitutional:      Appearance: Normal appearance. She is normal weight.  HENT:     Head: Normocephalic and atraumatic.     Mouth/Throat:     Mouth: Mucous membranes are moist.     Comments: Last dental exam 8 years ago; caries Eyes:     Conjunctiva/sclera: Conjunctivae normal.  Cardiovascular:     Rate and Rhythm: Normal rate and regular rhythm.   Pulmonary:     Effort: Pulmonary effort is normal.     Breath sounds: Normal breath sounds.  Chest:  Breasts:    Right: Normal.     Left: Normal.  Abdominal:     Palpations: Abdomen is soft.     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     Exam position: Lithotomy position.     Vagina: Vaginal discharge (white creamy leukorrhea, ph<4.5) present.     Cervix: Normal.     Uterus: Normal.      Adnexa: Right adnexa normal and left adnexa normal.     Rectum: Normal.     Comments: Cervix deviated to right; pap done Musculoskeletal:        General: Normal range of motion.     Cervical back: Normal range of motion and neck supple.  Skin:    General: Skin is warm and dry.  Neurological:     Mental Status: She is alert.  Psychiatric:        Mood and Affect: Mood normal.      Assessment and Plan:  Diamond Tyler is a 36 y.o. female presenting to the [facility] Department for an initial annual wellness/contraceptive visit  Contraception counseling: Reviewed options based on patient desire and reproductive life plan. Patient is interested in IUD or IUS. This was provided to the patient today.  if not why not clearly documented  Risks, benefits, and typical effectiveness rates were reviewed.  Questions were answered.  Written information was also given to the patient to review.    The patient will follow up in  1 years for surveillance.  The patient was told to call with any further questions, or with any concerns about this method of contraception.  Emphasized use of condoms 100% of the time for STI prevention.  Need for ECP was assessed. Patient reported > 120 hours .  Reviewed options and patient desired LNG -IUD   1. Overweight BMI=28.8   2. Family planning Please give dental list to pt Treat wet mount per standing orders Immunization nurse consult  - WET  PREP FOR [place], YEAST, CLUE - Chlamydia/Gonorrhea [place] [facility] - Syphilis Serology, [place] [facility] - HIV/HCV  Lab - IGP, Aptima HPV  3. Encounter for removal of intrauterine contraceptive device (IUD)   IUD Removal  Patient identified, informed consent performed, consent signed.  Patient was in the dorsal lithotomy position, normal external genitalia was noted.  A speculum was placed in the patient's vagina, normal discharge was noted, no lesions. The cervix was visualized, no lesions, no abnormal discharge.  The strings of the IUD were grasped and pulled using ring forceps. The IUD was removed in its entirety.  Patient tolerated the procedure well.  Patient presented to [facility] for IUD insertion. Her GC/CT screening was found to be up to date and using WHO criteria we can be reasonably certain she is not pregnant or a pregnancy test was obtained which was Urine pregnancy test  today was N/A.  See Flowsheet for IUD check list  IUD Insertion Procedure Note  Liletta IUD Patient identified, informed consent performed, consent signed.   Discussed risks of irregular bleeding, cramping, infection, malpositioning or misplacement of the IUD outside the uterus which may require further procedure such as laparoscopy. Time out was performed.    Speculum placed in the vagina.  Cervix visualized.  Cleaned with Betadine x 2.  Grasped anteriorly with a single tooth tenaculum.  Uterus sounded to 6 cm.  Cervix clamped down with insertion of IUD in os so pressure applied and handed off to Glenna Fellows, FNP who was able to insert the remainder after cervix relaxed.  IUD placed per manufacturer's recommendations.  Strings trimmed to 3 cm. Tenaculum was removed, good hemostasis noted.  Patient tolerated procedure well.   Patient was given post-procedure instructions- both agency handout and verbally by provider.  She was advised to have backup contraception for one week.  Patient was also asked to check IUD  strings periodically or follow up in 4 weeks for IUD check.  Patient will use abstinance for contraception next 7 days.  Routine preventative health maintenance measures emphasized.      No follow-ups on file.  No future appointments.  Alberteen Spindle, CNM

## 2022-06-16 NOTE — Progress Notes (Signed)
After called to clinic, needed to use bathroom. Specimen in lab if needed. Jossie Ng, RN Wet prep reviewed by Hazle Coca CNM and treated for yeast per standing order.Jossie Ng, RN

## 2022-06-22 LAB — IGP, APTIMA HPV
HPV Aptima: NEGATIVE
PAP Smear Comment: 0
